# Patient Record
Sex: Female | Born: 1975 | Race: White | Hispanic: No | Marital: Married | Smoking: Former smoker
Health system: Southern US, Community
[De-identification: ages and names within clinical notes are randomized; demographics above are authoritative.]

## PROBLEM LIST (undated history)

## (undated) DIAGNOSIS — F419 Anxiety disorder, unspecified: Secondary | ICD-10-CM

## (undated) DIAGNOSIS — F32A Depression, unspecified: Secondary | ICD-10-CM

## (undated) DIAGNOSIS — K829 Disease of gallbladder, unspecified: Secondary | ICD-10-CM

## (undated) DIAGNOSIS — R002 Palpitations: Secondary | ICD-10-CM

## (undated) DIAGNOSIS — R7303 Prediabetes: Secondary | ICD-10-CM

## (undated) DIAGNOSIS — E039 Hypothyroidism, unspecified: Secondary | ICD-10-CM

## (undated) DIAGNOSIS — R0602 Shortness of breath: Secondary | ICD-10-CM

## (undated) DIAGNOSIS — E559 Vitamin D deficiency, unspecified: Secondary | ICD-10-CM

## (undated) DIAGNOSIS — F17200 Nicotine dependence, unspecified, uncomplicated: Principal | ICD-10-CM

## (undated) DIAGNOSIS — K219 Gastro-esophageal reflux disease without esophagitis: Secondary | ICD-10-CM

## (undated) DIAGNOSIS — F329 Major depressive disorder, single episode, unspecified: Secondary | ICD-10-CM

## (undated) DIAGNOSIS — E7800 Pure hypercholesterolemia, unspecified: Secondary | ICD-10-CM

## (undated) DIAGNOSIS — R12 Heartburn: Secondary | ICD-10-CM

## (undated) DIAGNOSIS — T7840XA Allergy, unspecified, initial encounter: Secondary | ICD-10-CM

## (undated) DIAGNOSIS — K9041 Non-celiac gluten sensitivity: Secondary | ICD-10-CM

## (undated) DIAGNOSIS — E079 Disorder of thyroid, unspecified: Secondary | ICD-10-CM

## (undated) DIAGNOSIS — E739 Lactose intolerance, unspecified: Secondary | ICD-10-CM

## (undated) HISTORY — DX: Non-celiac gluten sensitivity: K90.41

## (undated) HISTORY — DX: Allergy, unspecified, initial encounter: T78.40XA

## (undated) HISTORY — PX: BREAST SURGERY: SHX581

## (undated) HISTORY — DX: Gastro-esophageal reflux disease without esophagitis: K21.9

## (undated) HISTORY — DX: Disorder of thyroid, unspecified: E07.9

## (undated) HISTORY — DX: Pure hypercholesterolemia, unspecified: E78.00

## (undated) HISTORY — DX: Hypothyroidism, unspecified: E03.9

## (undated) HISTORY — DX: Anxiety disorder, unspecified: F41.9

## (undated) HISTORY — DX: Major depressive disorder, single episode, unspecified: F32.9

## (undated) HISTORY — DX: Depression, unspecified: F32.A

## (undated) HISTORY — DX: Heartburn: R12

## (undated) HISTORY — PX: CHOLECYSTECTOMY, LAPAROSCOPIC: SHX56

## (undated) HISTORY — DX: Disease of gallbladder, unspecified: K82.9

## (undated) HISTORY — DX: Lactose intolerance, unspecified: E73.9

## (undated) HISTORY — PX: CHOLECYSTECTOMY: SHX55

## (undated) HISTORY — DX: Palpitations: R00.2

## (undated) HISTORY — DX: Shortness of breath: R06.02

## (undated) HISTORY — DX: Vitamin D deficiency, unspecified: E55.9

## (undated) HISTORY — DX: Nicotine dependence, unspecified, uncomplicated: F17.200

## (undated) HISTORY — DX: Prediabetes: R73.03

---

## 2008-06-16 ENCOUNTER — Ambulatory Visit (HOSPITAL_COMMUNITY): Admission: RE | Admit: 2008-06-16 | Discharge: 2008-06-16 | Payer: Self-pay | Admitting: Obstetrics and Gynecology

## 2008-10-09 ENCOUNTER — Inpatient Hospital Stay (HOSPITAL_COMMUNITY): Admission: AD | Admit: 2008-10-09 | Discharge: 2008-10-12 | Payer: Self-pay | Admitting: Obstetrics & Gynecology

## 2009-12-13 ENCOUNTER — Emergency Department (HOSPITAL_COMMUNITY): Admission: EM | Admit: 2009-12-13 | Discharge: 2009-12-13 | Payer: Self-pay | Admitting: Family Medicine

## 2011-04-04 ENCOUNTER — Emergency Department (HOSPITAL_COMMUNITY)
Admission: EM | Admit: 2011-04-04 | Discharge: 2011-04-04 | Disposition: A | Discharge status: Home / Self Care | Payer: 59 | Attending: Emergency Medicine | Admitting: Emergency Medicine

## 2011-04-04 DIAGNOSIS — F17200 Nicotine dependence, unspecified, uncomplicated: Secondary | ICD-10-CM | POA: Insufficient documentation

## 2011-04-04 DIAGNOSIS — L02419 Cutaneous abscess of limb, unspecified: Secondary | ICD-10-CM | POA: Insufficient documentation

## 2011-04-04 DIAGNOSIS — L03119 Cellulitis of unspecified part of limb: Secondary | ICD-10-CM | POA: Insufficient documentation

## 2011-04-04 DIAGNOSIS — L738 Other specified follicular disorders: Secondary | ICD-10-CM | POA: Insufficient documentation

## 2011-04-04 LAB — POCT PREGNANCY, URINE: Preg Test, Ur: NEGATIVE

## 2011-07-20 LAB — RH IMMUNE GLOB WKUP(>/=20WKS)(NOT WOMEN'S HOSP)

## 2011-07-20 LAB — CBC
HCT: 35.9 % — ABNORMAL LOW (ref 36.0–46.0)
Hemoglobin: 10.9 g/dL — ABNORMAL LOW (ref 12.0–15.0)
Hemoglobin: 12.3 g/dL (ref 12.0–15.0)
MCHC: 34.5 g/dL (ref 30.0–36.0)
MCV: 90.9 fL (ref 78.0–100.0)
RBC: 3.48 MIL/uL — ABNORMAL LOW (ref 3.87–5.11)
RBC: 3.96 MIL/uL (ref 3.87–5.11)
RDW: 14.7 % (ref 11.5–15.5)
WBC: 7.7 10*3/uL (ref 4.0–10.5)
WBC: 9.2 10*3/uL (ref 4.0–10.5)

## 2013-02-23 ENCOUNTER — Ambulatory Visit (INDEPENDENT_AMBULATORY_CARE_PROVIDER_SITE_OTHER): Payer: Self-pay | Admitting: Family Medicine

## 2013-02-23 ENCOUNTER — Encounter: Payer: Self-pay | Admitting: Family Medicine

## 2013-02-23 VITALS — BP 130/94 | HR 76 | Temp 98.1°F | Resp 16 | Wt 196.0 lb

## 2013-02-23 DIAGNOSIS — F341 Dysthymic disorder: Secondary | ICD-10-CM

## 2013-02-23 DIAGNOSIS — F329 Major depressive disorder, single episode, unspecified: Secondary | ICD-10-CM

## 2013-02-23 DIAGNOSIS — F17200 Nicotine dependence, unspecified, uncomplicated: Secondary | ICD-10-CM | POA: Insufficient documentation

## 2013-02-23 HISTORY — DX: Nicotine dependence, unspecified, uncomplicated: F17.200

## 2013-02-23 MED ORDER — LORAZEPAM 0.5 MG PO TABS: 0.5000 mg | ORAL_TABLET | Freq: Three times a day (TID) | ORAL | Status: DC | PRN

## 2013-02-23 MED ORDER — BUPROPION HCL ER (XL) 150 MG PO TB24: 300.0000 mg | ORAL_TABLET | Freq: Every day | ORAL | Status: DC

## 2013-02-23 NOTE — Progress Notes (Signed)
  Subjective:    Patient ID: [name], female    DOB: Oct 23, 1975, 37 y.o.   MRN: [id number]  HPI Patient is here today for assistance with smoking cessation. Unfortunately she's also going to a divorce. She reports depression and increasing anxiety. She's also having episodes of alopecia which she attributes to stress. She's having a difficult time sleeping and concentrating. She like something to help her sleep at night. She's also systems hindgut smoking now even in this time stress. She's taken Wellbutrin in the past and has successfully quit smoking temporarily on this. Furthermore she thinks it also helps with some of her mood symptoms. She's therefore motivated to try this medication. Past Medical History  Diagnosis Date  . Smoker 02/23/2013   No current outpatient prescriptions on file prior to visit.   No current facility-administered medications on file prior to visit.   Allergies  Allergen Reactions  . Penicillins       Review of Systems  All other systems reviewed and are negative.       Objective:   Physical Exam  Vitals reviewed. Cardiovascular: Normal rate and regular rhythm.   Pulmonary/Chest: Effort normal and breath sounds normal.  Psychiatric: She has a normal mood and affect. Her behavior is normal. Judgment and thought content normal.          Assessment & Plan:  1. Smoker I congratulated the patient kind quit smoking. She is assistant to try this now. Therefore we will start Wellbutrin XL 150 mg tablets one by mouth every morning. She's to continue this for one week. She will then increase to 2 tablets by mouth every morning.   2. Depression, reactive Most likely related to her impending divorce. Will start Wellbutrin to help smoking cessation. Also prescribed her Ativan 0.5 mg tablets. She can take 1 tablet every 8 hours as needed for anxiety or insomnia. 30 tablets 0 refills.  Follow up in one month or sooner if worse.

## 2014-03-16 ENCOUNTER — Other Ambulatory Visit: Payer: BC Managed Care – PPO

## 2014-03-16 DIAGNOSIS — F341 Dysthymic disorder: Secondary | ICD-10-CM

## 2014-03-16 DIAGNOSIS — Z79899 Other long term (current) drug therapy: Secondary | ICD-10-CM

## 2014-03-16 DIAGNOSIS — Z0000 Encounter for general adult medical examination without abnormal findings: Secondary | ICD-10-CM

## 2014-03-16 DIAGNOSIS — F17200 Nicotine dependence, unspecified, uncomplicated: Secondary | ICD-10-CM

## 2014-03-16 LAB — CBC WITH DIFFERENTIAL/PLATELET
BASOS ABS: 0.1 10*3/uL (ref 0.0–0.1)
Basophils Relative: 1 % (ref 0–1)
EOS PCT: 4 % (ref 0–5)
Eosinophils Absolute: 0.3 10*3/uL (ref 0.0–0.7)
HEMATOCRIT: 40.7 % (ref 36.0–46.0)
HEMOGLOBIN: 13.9 g/dL (ref 12.0–15.0)
LYMPHS PCT: 46 % (ref 12–46)
Lymphs Abs: 2.9 10*3/uL (ref 0.7–4.0)
MCH: 29.1 pg (ref 26.0–34.0)
MCHC: 34.2 g/dL (ref 30.0–36.0)
MCV: 85.3 fL (ref 78.0–100.0)
MONO ABS: 0.6 10*3/uL (ref 0.1–1.0)
MONOS PCT: 9 % (ref 3–12)
Neutro Abs: 2.5 10*3/uL (ref 1.7–7.7)
Neutrophils Relative %: 40 % — ABNORMAL LOW (ref 43–77)
Platelets: 260 10*3/uL (ref 150–400)
RBC: 4.77 MIL/uL (ref 3.87–5.11)
RDW: 14.5 % (ref 11.5–15.5)
WBC: 6.3 10*3/uL (ref 4.0–10.5)

## 2014-03-16 LAB — LIPID PANEL
CHOL/HDL RATIO: 4.2 ratio
Cholesterol: 191 mg/dL (ref 0–200)
HDL: 45 mg/dL (ref >39)
LDL CALC: 115 mg/dL — AB (ref 0–99)
Triglycerides: 154 mg/dL — ABNORMAL HIGH (ref <150)
VLDL: 31 mg/dL (ref 0–40)

## 2014-03-16 LAB — COMPLETE METABOLIC PANEL WITH GFR
ALK PHOS: 54 U/L (ref 39–117)
ALT: 12 U/L (ref 0–35)
AST: 14 U/L (ref 0–37)
Albumin: 4.1 g/dL (ref 3.5–5.2)
BILIRUBIN TOTAL: 0.2 mg/dL (ref 0.2–1.2)
BUN: 14 mg/dL (ref 6–23)
CO2: 23 mEq/L (ref 19–32)
CREATININE: 0.72 mg/dL (ref 0.50–1.10)
Calcium: 9.3 mg/dL (ref 8.4–10.5)
Chloride: 104 mEq/L (ref 96–112)
GFR, Est African American: >89 mL/min
GLUCOSE: 86 mg/dL (ref 70–99)
Potassium: 3.8 mEq/L (ref 3.5–5.3)
Sodium: 137 mEq/L (ref 135–145)
Total Protein: 6.9 g/dL (ref 6.0–8.3)

## 2014-03-16 LAB — TSH: TSH: 4.483 u[IU]/mL (ref 0.350–4.500)

## 2014-03-17 LAB — VITAMIN D 25 HYDROXY (VIT D DEFICIENCY, FRACTURES): Vit D, 25-Hydroxy: 60 ng/mL (ref 30–89)

## 2014-03-18 ENCOUNTER — Ambulatory Visit (INDEPENDENT_AMBULATORY_CARE_PROVIDER_SITE_OTHER): Payer: BC Managed Care – PPO | Admitting: Family Medicine

## 2014-03-18 ENCOUNTER — Encounter: Payer: Self-pay | Admitting: Family Medicine

## 2014-03-18 VITALS — BP 138/70 | HR 76 | Temp 97.9°F | Resp 14 | Ht 64.0 in | Wt 199.0 lb

## 2014-03-18 DIAGNOSIS — Z79899 Other long term (current) drug therapy: Secondary | ICD-10-CM

## 2014-03-18 DIAGNOSIS — Z0000 Encounter for general adult medical examination without abnormal findings: Secondary | ICD-10-CM

## 2014-03-18 DIAGNOSIS — F411 Generalized anxiety disorder: Secondary | ICD-10-CM

## 2014-03-18 DIAGNOSIS — F419 Anxiety disorder, unspecified: Secondary | ICD-10-CM | POA: Insufficient documentation

## 2014-03-18 MED ORDER — HYDROCODONE-ACETAMINOPHEN 5-325 MG PO TABS: 1.0000 | ORAL_TABLET | Freq: Four times a day (QID) | ORAL | Status: DC | PRN

## 2014-03-18 MED ORDER — VENLAFAXINE HCL ER 75 MG PO CP24: 150.0000 mg | ORAL_CAPSULE | Freq: Every day | ORAL | Status: DC

## 2014-03-18 NOTE — Progress Notes (Signed)
Subjective:    Patient ID: Karen [name], female    DOB: 07/10/1976, 38 y.o.   MRN: [id number]  HPI This is a 38 year old white female who is here today for complete physical exam. She is overdue for Pap smear. Her biggest medical problem is smoking. However she smokes [place] anxiety. She states that she suffers from daily generalized anxiety. She smokes as a stress reliever. She does not believe that she would be able to conquer her smoking habit and to better control her anxiety. She also is receiving electrolysis treatments to her face for hirsutism.  She goes once a week. This is extremely painful. She tried 800 mg ibuprofen is not helping. She is wondering if we can get her up she did take 30 minutes before the treatment once a week to help minimize the pain.  In the past he has tried Lexapro for anxiety. This causes libido. She tried Wellbutrin which did not help. Past Medical History  Diagnosis Date  . Smoker 02/23/2013  . Smoker   . Anxiety   . Depression    No past surgical history on file. Current Outpatient Prescriptions on File Prior to Visit  Medication Sig Dispense Refill  . fish oil-omega-3 fatty acids 1000 MG capsule Take 1 g by mouth daily.      Marland Kitchen. aspirin 81 MG tablet Take 81 mg by mouth daily.      Marland Kitchen. buPROPion (WELLBUTRIN XL) 150 MG 24 hr tablet Take 2 tablets (300 mg total) by mouth daily.  60 tablet  5  . LORazepam (ATIVAN) 0.5 MG tablet Take 1 tablet (0.5 mg total) by mouth every 8 (eight) hours as needed for anxiety.  30 tablet  0   No current facility-administered medications on file prior to visit.   Allergies  Allergen Reactions  . Penicillins    History   Social History  . Marital Status: Married    Spouse Name: N/A    Number of Children: N/A  . Years of Education: N/A   Occupational History  . Not on file.   Social History Main Topics  . Smoking status: Current Every Day Smoker    Types: Cigarettes  . Smokeless tobacco: Never Used     Comment: 3/4  pack a day  . Alcohol Use: Yes     Comment: Rare  . Drug Use: No  . Sexual Activity: Yes   Other Topics Concern  . Not on file   Social History Narrative  . No narrative on file   Family History  Problem Relation Age of Onset  . Diabetes Mother   . Hyperlipidemia Mother   . Hypertension Mother   . Hypertension Father       Review of Systems  All other systems reviewed and are negative.      Objective:   Physical Exam  Vitals reviewed. Constitutional: She is oriented to person, place, and time. She appears well-developed and well-nourished. No distress.  HENT:  Head: Normocephalic and atraumatic.  Right Ear: External ear normal.  Left Ear: External ear normal.  Nose: Nose normal.  Mouth/Throat: Oropharynx is clear and moist. No oropharyngeal exudate.  Eyes: Conjunctivae and EOM are normal. Pupils are equal, round, and reactive to light. Right eye exhibits no discharge. Left eye exhibits no discharge. No scleral icterus.  Neck: Normal range of motion. Neck supple. No JVD present. No tracheal deviation present. No thyromegaly present.  Cardiovascular: Normal rate, regular rhythm, normal heart sounds and intact distal pulses.  Exam reveals no gallop and no friction rub.   No murmur heard. Pulmonary/Chest: Effort normal and breath sounds normal. No stridor. No respiratory distress. She has no wheezes. She has no rales. She exhibits no tenderness.  Abdominal: Soft. Bowel sounds are normal. She exhibits no distension and no mass. There is no tenderness. There is no rebound and no guarding.  Genitourinary: Vagina normal.  Musculoskeletal: Normal range of motion. She exhibits no edema and no tenderness.  Lymphadenopathy:    She has no cervical adenopathy.  Neurological: She is alert and oriented to person, place, and time. She has normal reflexes. She displays normal reflexes. No cranial nerve deficit. She exhibits normal muscle tone. Coordination normal.  Skin: Skin is warm.  No rash noted. She is not diaphoretic. No erythema. No pallor.  Psychiatric: She has a normal mood and affect. Her behavior is normal. Judgment and thought content normal.          Assessment & Plan:  1. Routine general medical examination at a health care facility I reviewed the patient's CBC CMP fasting lipid panel and TSH all of which were within normal limits. Performed a Pap smear. This was sent to pathology. The remainder of her preventative care is up-to-date. - PAP, Thin Prep w/HPV rflx HPV Type 16/18  2. Encounter for long-term (current) use of other medications Discontinue Wellbutrin and lorazepam  3. GAD (generalized anxiety disorder) Begin Effexor XR 75 mg by mouth every morning and in one week increase to 150 mg by mouth every morning. Recheck in one month.  Once we have better manage anxiety I like to focus on smoking cessation.  I did give the patient prescription for Vicodin 5/325 one by mouth every week 30 minutes prior to electrolysis. I gave her 60 tablets. This should last for 1 year. - venlafaxine XR (EFFEXOR-XR) 75 MG 24 hr capsule; Take 2 capsules (150 mg total) by mouth daily with breakfast.  Dispense: 60 capsule; Refill: 5

## 2014-03-22 LAB — PAP, THIN PREP W/HPV RFLX HPV TYPE 16/18: HPV DNA HIGH RISK: NOT DETECTED

## 2014-05-06 ENCOUNTER — Telehealth: Payer: Self-pay | Admitting: Family Medicine

## 2014-05-06 NOTE — Telephone Encounter (Signed)
Patient calling back to let dr pickard know how her effexor  was working  She says it is not really doing anything for her please call patient back at  [phone]

## 2014-05-10 NOTE — Telephone Encounter (Signed)
We could try brintellix 10 mg poqday and dc effexor if she is interested.

## 2014-05-11 NOTE — Telephone Encounter (Signed)
Pt wishes to not start another medication at this time. She would like to check with her insurance to see if Chantix is covered and would like to try that for the smoking but does not feel she needs something daily for anxiety.

## 2015-12-09 ENCOUNTER — Ambulatory Visit (INDEPENDENT_AMBULATORY_CARE_PROVIDER_SITE_OTHER): Payer: 59 | Admitting: Family Medicine

## 2015-12-09 ENCOUNTER — Encounter: Payer: Self-pay | Admitting: Family Medicine

## 2015-12-09 VITALS — BP 114/80 | HR 76 | Temp 98.4°F | Resp 18 | Ht 62.5 in | Wt 222.0 lb

## 2015-12-09 DIAGNOSIS — Z0000 Encounter for general adult medical examination without abnormal findings: Secondary | ICD-10-CM

## 2015-12-09 DIAGNOSIS — Z720 Tobacco use: Secondary | ICD-10-CM

## 2015-12-09 DIAGNOSIS — F17200 Nicotine dependence, unspecified, uncomplicated: Secondary | ICD-10-CM

## 2015-12-09 LAB — CBC WITH DIFFERENTIAL/PLATELET
BASOS PCT: 0 % (ref 0–1)
Basophils Absolute: 0 10*3/uL (ref 0.0–0.1)
Eosinophils Absolute: 0.1 10*3/uL (ref 0.0–0.7)
Eosinophils Relative: 2 % (ref 0–5)
HEMATOCRIT: 47 % — AB (ref 36.0–46.0)
Hemoglobin: 15.8 g/dL — ABNORMAL HIGH (ref 12.0–15.0)
Lymphocytes Relative: 37 % (ref 12–46)
Lymphs Abs: 2.3 10*3/uL (ref 0.7–4.0)
MCH: 29.3 pg (ref 26.0–34.0)
MCHC: 33.6 g/dL (ref 30.0–36.0)
MCV: 87 fL (ref 78.0–100.0)
MPV: 10.2 fL (ref 8.6–12.4)
Monocytes Absolute: 0.5 10*3/uL (ref 0.1–1.0)
Monocytes Relative: 8 % (ref 3–12)
NEUTROS ABS: 3.3 10*3/uL (ref 1.7–7.7)
NEUTROS PCT: 53 % (ref 43–77)
PLATELETS: 250 10*3/uL (ref 150–400)
RBC: 5.4 MIL/uL — AB (ref 3.87–5.11)
RDW: 15.1 % (ref 11.5–15.5)
WBC: 6.2 10*3/uL (ref 4.0–10.5)

## 2015-12-09 LAB — COMPLETE METABOLIC PANEL WITH GFR
ALT: 13 U/L (ref 6–29)
AST: 13 U/L (ref 10–30)
Albumin: 4 g/dL (ref 3.6–5.1)
Alkaline Phosphatase: 64 U/L (ref 33–115)
BILIRUBIN TOTAL: 0.3 mg/dL (ref 0.2–1.2)
BUN: 7 mg/dL (ref 7–25)
CALCIUM: 9.2 mg/dL (ref 8.6–10.2)
CHLORIDE: 104 mmol/L (ref 98–110)
CO2: 20 mmol/L (ref 20–31)
CREATININE: 0.66 mg/dL (ref 0.50–1.10)
Glucose, Bld: 87 mg/dL (ref 70–99)
Potassium: 4.2 mmol/L (ref 3.5–5.3)
Sodium: 137 mmol/L (ref 135–146)
Total Protein: 7.2 g/dL (ref 6.1–8.1)

## 2015-12-09 LAB — LIPID PANEL
CHOLESTEROL: 201 mg/dL — AB (ref 125–200)
HDL: 46 mg/dL (ref >=46)
LDL Cholesterol: 132 mg/dL — ABNORMAL HIGH (ref <130)
TRIGLYCERIDES: 116 mg/dL (ref <150)
Total CHOL/HDL Ratio: 4.4 Ratio (ref <=5.0)
VLDL: 23 mg/dL (ref <30)

## 2015-12-09 MED ORDER — VARENICLINE TARTRATE 0.5 MG X 11 & 1 MG X 42 PO MISC: ORAL | Status: DC

## 2015-12-09 MED ORDER — VARENICLINE TARTRATE 1 MG PO TABS: 1.0000 mg | ORAL_TABLET | Freq: Two times a day (BID) | ORAL | Status: DC

## 2015-12-09 NOTE — Progress Notes (Signed)
Subjective:    Patient ID: Karen [name], female    DOB: August 16, 1976, 40 y.o.   MRN: [id number]  HPI  This is a 40 year old white female who is here today for complete physical exam. Her last Pap smear was 2015 and was normal. It is not due again until 2018. She will be due for mammogram when she turns 40 this may. Her biggest medical problem is smoking. She is interested in quitting smoking. She is also encouraging her husband to quit smoking. She would like to try Chantix again which did work for her in the past without any side effects. Patient works as a Engineer, civil (consulting) on the renal floor at [name] Past Medical History  Diagnosis Date  . Smoker 02/23/2013  . Smoker   . Anxiety   . Depression    No past surgical history on file. Current Outpatient Prescriptions on File Prior to Visit  Medication Sig Dispense Refill  . aspirin 81 MG tablet Take 81 mg by mouth daily.    . fish oil-omega-3 fatty acids 1000 MG capsule Take 1 g by mouth daily.    Marland Kitchen ibuprofen (ADVIL,MOTRIN) 800 MG tablet Take 800 mg by mouth once a week.    Marland Kitchen buPROPion (WELLBUTRIN XL) 150 MG 24 hr tablet Take 2 tablets (300 mg total) by mouth daily. (Patient not taking: Reported on 12/09/2015) 60 tablet 5  . LORazepam (ATIVAN) 0.5 MG tablet Take 1 tablet (0.5 mg total) by mouth every 8 (eight) hours as needed for anxiety. (Patient not taking: Reported on 12/09/2015) 30 tablet 0  . venlafaxine XR (EFFEXOR-XR) 75 MG 24 hr capsule Take 2 capsules (150 mg total) by mouth daily with breakfast. (Patient not taking: Reported on 12/09/2015) 60 capsule 5   No current facility-administered medications on file prior to visit.   Allergies  Allergen Reactions  . Penicillins    Social History   Social History  . Marital Status: Married    Spouse Name: N/A  . Number of Children: N/A  . Years of Education: N/A   Occupational History  . Not on file.   Social History Main Topics  . Smoking status: Current Every Day Smoker   Types: Cigarettes  . Smokeless tobacco: Never Used     Comment: 3/4 pack a day  . Alcohol Use: Yes     Comment: Rare  . Drug Use: No  . Sexual Activity: Yes   Other Topics Concern  . Not on file   Social History Narrative   Family History  Problem Relation Age of Onset  . Diabetes Mother   . Hyperlipidemia Mother   . Hypertension Mother   . Hypertension Father       Review of Systems  All other systems reviewed and are negative.      Objective:   Physical Exam  Constitutional: She is oriented to person, place, and time. She appears well-developed and well-nourished. No distress.  HENT:  Head: Normocephalic and atraumatic.  Right Ear: External ear normal.  Left Ear: External ear normal.  Nose: Nose normal.  Mouth/Throat: Oropharynx is clear and moist. No oropharyngeal exudate.  Eyes: Conjunctivae and EOM are normal. Pupils are equal, round, and reactive to light. Right eye exhibits no discharge. Left eye exhibits no discharge. No scleral icterus.  Neck: Normal range of motion. Neck supple. No JVD present. No tracheal deviation present. No thyromegaly present.  Cardiovascular: Normal rate, regular rhythm, normal heart sounds and intact distal pulses.  Exam reveals no  gallop and no friction rub.   No murmur heard. Pulmonary/Chest: Effort normal and breath sounds normal. No stridor. No respiratory distress. She has no wheezes. She has no rales. She exhibits no tenderness.  Abdominal: Soft. Bowel sounds are normal. She exhibits no distension and no mass. There is no tenderness. There is no rebound and no guarding.  Genitourinary: Vagina normal.  Musculoskeletal: Normal range of motion. She exhibits no edema or tenderness.  Lymphadenopathy:    She has no cervical adenopathy.  Neurological: She is alert and oriented to person, place, and time. She has normal reflexes. No cranial nerve deficit. She exhibits normal muscle tone. Coordination normal.  Skin: Skin is warm. No rash  noted. She is not diaphoretic. No erythema. No pallor.  Psychiatric: She has a normal mood and affect. Her behavior is normal. Judgment and thought content normal.  Vitals reviewed.         Assessment & Plan:  Smoking - Plan: varenicline (CHANTIX STARTING MONTH PAK) 0.5 MG X 11 & 1 MG X 42 tablet  Routine general medical examination at a health care facility - Plan: CBC with Differential/Platelet, COMPLETE METABOLIC PANEL WITH GFR, Lipid panel, MM Digital Screening  Medical examination significant for obesity. I recommended diet exercise and weight loss. I'm very proud of the patient for him to quit smoking. I gave the patient a prescription for Chantix. I gave her a starter pack. In one month she can begin the continuing pack and take that for up to 3 months if necessary. Also recommended calcium and vitamin D for prevention of osteoporosis. I will schedule the patient for mammogram after she turns 40. Her flu shot is up-to-date. Her Pap smear is not due until next year. I will also check a CBC, CMP, and fasting lipid panel.

## 2015-12-14 ENCOUNTER — Encounter: Payer: Self-pay | Admitting: Family Medicine

## 2016-01-09 ENCOUNTER — Other Ambulatory Visit: Payer: Self-pay | Admitting: Family Medicine

## 2016-01-09 DIAGNOSIS — Z1231 Encounter for screening mammogram for malignant neoplasm of breast: Secondary | ICD-10-CM

## 2016-01-26 ENCOUNTER — Ambulatory Visit (HOSPITAL_COMMUNITY)
Admission: RE | Admit: 2016-01-26 | Discharge: 2016-01-26 | Disposition: A | Discharge status: Home / Self Care | Payer: 59 | Source: Ambulatory Visit | Attending: Family Medicine | Admitting: Family Medicine

## 2016-01-26 ENCOUNTER — Encounter: Payer: Self-pay | Admitting: Family Medicine

## 2016-01-26 ENCOUNTER — Ambulatory Visit (INDEPENDENT_AMBULATORY_CARE_PROVIDER_SITE_OTHER): Payer: 59 | Admitting: Family Medicine

## 2016-01-26 VITALS — BP 128/70 | HR 80 | Temp 98.2°F | Resp 16 | Ht 62.5 in | Wt 228.0 lb

## 2016-01-26 DIAGNOSIS — M5432 Sciatica, left side: Secondary | ICD-10-CM | POA: Insufficient documentation

## 2016-01-26 DIAGNOSIS — M545 Low back pain: Secondary | ICD-10-CM | POA: Diagnosis not present

## 2016-01-26 MED ORDER — PREDNISONE 20 MG PO TABS: ORAL_TABLET | ORAL | Status: DC

## 2016-01-26 NOTE — Progress Notes (Signed)
   Subjective:    Patient ID: [name], female    DOB: 07/16/1976, 40 y.o.   MRN: [id number]  HPI 2 years ago, the patient injured her tailbone while driving to [place]. By the time she had arrived to [place], her entire sacral area was numb and tingling. She has had low back pain with numbness and tingling around the sacrum ever since. 2 weeks ago the symptoms worsened. She is developed low back pain with neuropathic burning and stinging pain radiating into her left gluteus, down her left hamstring, all the way into her left foot. She denies any saddle anesthesia. She denies any bowel or bladder incontinence. However the pain is becoming very severe.mh No past surgical history on file. Current Outpatient Prescriptions on File Prior to Visit  Medication Sig Dispense Refill  . aspirin 81 MG tablet Take 81 mg by mouth daily.    . fish oil-omega-3 fatty acids 1000 MG capsule Take 1 g by mouth daily.    Marland Kitchen. ibuprofen (ADVIL,MOTRIN) 800 MG tablet Take 800 mg by mouth once a week.    . varenicline (CHANTIX CONTINUING MONTH PAK) 1 MG tablet Take 1 tablet (1 mg total) by mouth 2 (two) times daily. (Patient not taking: Reported on 01/26/2016) 60 tablet 2  . varenicline (CHANTIX STARTING MONTH PAK) 0.5 MG X 11 & 1 MG X 42 tablet Take one 0.5 mg tablet by mouth once daily for 3 days, then increase to one 0.5 mg tablet twice daily for 4 days, then increase to one 1 mg tablet twice daily. (Patient not taking: Reported on 01/26/2016) 53 tablet 0   No current facility-administered medications on file prior to visit.   Allergies  Allergen Reactions  . Penicillins    Social History   Social History  . Marital Status: Married    Spouse Name: N/A  . Number of Children: N/A  . Years of Education: N/A   Occupational History  . Not on file.   Social History Main Topics  . Smoking status: Current Every Day Smoker    Types: Cigarettes  . Smokeless tobacco: Never Used     Comment: 3/4 pack a day    . Alcohol Use: Yes     Comment: Rare  . Drug Use: No  . Sexual Activity: Yes   Other Topics Concern  . Not on file   Social History Narrative    .p   Review of Systems  All other systems reviewed and are negative.      Objective:   Physical Exam  Cardiovascular: Normal rate, regular rhythm and normal heart sounds.   Pulmonary/Chest: Effort normal and breath sounds normal. No respiratory distress. She has no wheezes. She has no rales.  Musculoskeletal:       Lumbar back: She exhibits decreased range of motion, tenderness and pain. She exhibits no spasm.  Neurological: She displays normal reflexes. She exhibits normal muscle tone. Coordination normal.  Vitals reviewed.         Assessment & Plan:  Sciatica associated with disorder of lumbar spine, left - Plan: DG Lumbar Spine Complete, predniSONE (DELTASONE) 20 MG tablet Patient has left-sided sciatica with no evidence of cauda equina syndrome. Begin prednisone taper pack. Obtain x-rays of the lumbar spine. If symptoms do not improve, proceed with an MRI of the lumbar spine.

## 2016-02-20 MED FILL — CHANTIX STARTING MONTH BOX: 0.5 MG X 11 | 28 days supply | Qty: 53 | Fill #0

## 2016-02-23 ENCOUNTER — Ambulatory Visit
Admission: RE | Admit: 2016-02-23 | Discharge: 2016-02-23 | Disposition: A | Discharge status: Home / Self Care | Payer: 59 | Source: Ambulatory Visit | Attending: Family Medicine | Admitting: Family Medicine

## 2016-02-23 DIAGNOSIS — Z1231 Encounter for screening mammogram for malignant neoplasm of breast: Secondary | ICD-10-CM | POA: Diagnosis not present

## 2016-03-23 MED FILL — CHANTIX 1 MG TABLET: 1 | 28 days supply | Qty: 56 | Fill #0

## 2016-04-20 MED FILL — CHANTIX 1 MG TABLET: 1 | 28 days supply | Qty: 56 | Fill #1

## 2016-04-27 ENCOUNTER — Ambulatory Visit: Payer: 59 | Admitting: Family Medicine

## 2016-04-27 ENCOUNTER — Encounter: Payer: Self-pay | Admitting: Family Medicine

## 2016-04-27 ENCOUNTER — Ambulatory Visit (INDEPENDENT_AMBULATORY_CARE_PROVIDER_SITE_OTHER): Payer: 59 | Admitting: Family Medicine

## 2016-04-27 VITALS — BP 138/94 | HR 76 | Temp 98.6°F | Resp 16 | Wt 233.0 lb

## 2016-04-27 DIAGNOSIS — R002 Palpitations: Secondary | ICD-10-CM | POA: Diagnosis not present

## 2016-04-27 LAB — CBC WITH DIFFERENTIAL/PLATELET
BASOS ABS: 0 {cells}/uL (ref 0–200)
Basophils Relative: 0 %
EOS ABS: 288 {cells}/uL (ref 15–500)
EOS PCT: 4 %
HEMATOCRIT: 42.4 % (ref 35.0–45.0)
HEMOGLOBIN: 14.4 g/dL (ref 12.0–15.0)
LYMPHS ABS: 3600 {cells}/uL (ref 850–3900)
Lymphocytes Relative: 50 %
MCH: 29.4 pg (ref 27.0–33.0)
MCHC: 34 g/dL (ref 32.0–36.0)
MCV: 86.7 fL (ref 80.0–100.0)
MPV: 9.6 fL (ref 7.5–12.5)
Monocytes Absolute: 576 cells/uL (ref 200–950)
Monocytes Relative: 8 %
NEUTROS ABS: 2736 {cells}/uL (ref 1500–7800)
NEUTROS PCT: 38 %
Platelets: 257 10*3/uL (ref 140–400)
RBC: 4.89 MIL/uL (ref 3.80–5.10)
RDW: 14.3 % (ref 11.0–15.0)
WBC: 7.2 10*3/uL (ref 3.8–10.8)

## 2016-04-27 LAB — COMPLETE METABOLIC PANEL WITH GFR
ALBUMIN: 3.6 g/dL (ref 3.6–5.1)
ALK PHOS: 66 U/L (ref 33–115)
ALT: 18 U/L (ref 6–29)
AST: 15 U/L (ref 10–30)
BILIRUBIN TOTAL: 0.2 mg/dL (ref 0.2–1.2)
BUN: 8 mg/dL (ref 7–25)
CALCIUM: 9.9 mg/dL (ref 8.6–10.2)
CO2: 20 mmol/L (ref 20–31)
Chloride: 103 mmol/L (ref 98–110)
Creat: 0.62 mg/dL (ref 0.50–1.10)
GLUCOSE: 85 mg/dL (ref 70–99)
POTASSIUM: 4 mmol/L (ref 3.5–5.3)
Sodium: 136 mmol/L (ref 135–146)
TOTAL PROTEIN: 6.8 g/dL (ref 6.1–8.1)

## 2016-04-27 LAB — TSH: TSH: 5.49 m[IU]/L — AB

## 2016-04-27 NOTE — Progress Notes (Signed)
   Subjective:    Patient ID: [name], female    DOB: 05/24/1976, 40 y.o.   MRN: [id number]  Palpitations   Over the last week, the patient reports numerous palpitations. They last only 1 or 2 seconds. She denies any syncope or presyncope or chest pain or shortness of breath. She blames them on Chantix. She denies any increased consumption of caffeine. She denies any insomnia. She denies any stress. She denies any stimulant use. EKG today shows normal sinus rhythm with normal axis and no evidence of ischemia or infarction  Past Medical History  Diagnosis Date  . Smoker 02/23/2013  . Smoker   . Anxiety   . Depression     No past surgical history on file. Current Outpatient Prescriptions on File Prior to Visit  Medication Sig Dispense Refill  . aspirin 81 MG tablet Take 81 mg by mouth daily.    . fish oil-omega-3 fatty acids 1000 MG capsule Take 1 g by mouth daily.    Marland Kitchen. ibuprofen (ADVIL,MOTRIN) 800 MG tablet Take 800 mg by mouth once a week.    . varenicline (CHANTIX CONTINUING MONTH PAK) 1 MG tablet Take 1 tablet (1 mg total) by mouth 2 (two) times daily. 60 tablet 2   No current facility-administered medications on file prior to visit.   Allergies  Allergen Reactions  . Penicillins    Social History   Social History  . Marital Status: Married    Spouse Name: N/A  . Number of Children: N/A  . Years of Education: N/A   Occupational History  . Not on file.   Social History Main Topics  . Smoking status: Former Smoker    Types: Cigarettes  . Smokeless tobacco: Former NeurosurgeonUser    Quit date: 03/25/2016  . Alcohol Use: 0.0 oz/week    0 Standard drinks or equivalent per week     Comment: Rare  . Drug Use: No  . Sexual Activity: Yes   Other Topics Concern  . Not on file   Social History Narrative     Review of Systems  Cardiovascular: Positive for palpitations.  All other systems reviewed and are negative.      Objective:   Physical Exam  Constitutional: She  appears well-developed and well-nourished. No distress.  Neck: No JVD present. No thyromegaly present.  Cardiovascular: Normal rate, regular rhythm and normal heart sounds.   Pulmonary/Chest: Effort normal and breath sounds normal. No respiratory distress. She has no wheezes. She has no rales.  Abdominal: Soft. Bowel sounds are normal.  Lymphadenopathy:    She has no cervical adenopathy.  Neurological: She displays normal reflexes. She exhibits normal muscle tone. Coordination normal.  Skin: She is not diaphoretic.  Vitals reviewed.         Assessment & Plan:  Palpitations - Plan: EKG 12-Lead, CBC with Differential/Platelet, COMPLETE METABOLIC PANEL WITH GFR, TSH Description certainly sounds like PVCs. I will obtain a Holter monitor to verify. EKG is normal. Check CBC to rule out anemia. Check CMP to evaluate electrolyte abnormalities. Check TSH to evaluate for thyroid problems. At the present time the patient is not interested in Toprol-XL but rather like just peace of mind. If symptoms worsen, we can try Toprol-XL.

## 2016-05-11 ENCOUNTER — Telehealth: Payer: Self-pay | Admitting: Family Medicine

## 2016-05-11 NOTE — Telephone Encounter (Signed)
Holter monitor results - Dr. Tanya Nones states that it show occasional PVC's & PAC's but no dangerous arrhythmia's.  Called placed to pt and [facility]

## 2016-05-16 NOTE — Telephone Encounter (Signed)
Patient aware of results.

## 2016-05-25 ENCOUNTER — Encounter: Payer: Self-pay | Admitting: Family Medicine

## 2016-12-27 ENCOUNTER — Ambulatory Visit (INDEPENDENT_AMBULATORY_CARE_PROVIDER_SITE_OTHER): Payer: 59 | Admitting: Family Medicine

## 2016-12-27 ENCOUNTER — Encounter: Payer: Self-pay | Admitting: Family Medicine

## 2016-12-27 VITALS — BP 118/66 | HR 78 | Temp 98.7°F | Resp 14 | Ht 62.5 in | Wt 243.0 lb

## 2016-12-27 DIAGNOSIS — Z0000 Encounter for general adult medical examination without abnormal findings: Secondary | ICD-10-CM

## 2016-12-27 DIAGNOSIS — E039 Hypothyroidism, unspecified: Secondary | ICD-10-CM | POA: Diagnosis not present

## 2016-12-27 DIAGNOSIS — Z124 Encounter for screening for malignant neoplasm of cervix: Secondary | ICD-10-CM | POA: Diagnosis not present

## 2016-12-27 DIAGNOSIS — E038 Other specified hypothyroidism: Secondary | ICD-10-CM

## 2016-12-27 DIAGNOSIS — Z87891 Personal history of nicotine dependence: Secondary | ICD-10-CM | POA: Diagnosis not present

## 2016-12-27 LAB — CBC WITH DIFFERENTIAL/PLATELET
BASOS ABS: 0 {cells}/uL (ref 0–200)
Basophils Relative: 0 %
Eosinophils Absolute: 195 cells/uL (ref 15–500)
Eosinophils Relative: 3 %
HEMATOCRIT: 42.3 % (ref 35.0–45.0)
HEMOGLOBIN: 13.9 g/dL (ref 12.0–15.0)
LYMPHS ABS: 2860 {cells}/uL (ref 850–3900)
Lymphocytes Relative: 44 %
MCH: 28 pg (ref 27.0–33.0)
MCHC: 32.9 g/dL (ref 32.0–36.0)
MCV: 85.1 fL (ref 80.0–100.0)
MONO ABS: 520 {cells}/uL (ref 200–950)
MPV: 10.1 fL (ref 7.5–12.5)
Monocytes Relative: 8 %
NEUTROS PCT: 45 %
Neutro Abs: 2925 cells/uL (ref 1500–7800)
Platelets: 262 10*3/uL (ref 140–400)
RBC: 4.97 MIL/uL (ref 3.80–5.10)
RDW: 14.5 % (ref 11.0–15.0)
WBC: 6.5 10*3/uL (ref 3.8–10.8)

## 2016-12-27 LAB — COMPLETE METABOLIC PANEL WITH GFR
ALBUMIN: 3.9 g/dL (ref 3.6–5.1)
ALK PHOS: 73 U/L (ref 33–115)
ALT: 17 U/L (ref 6–29)
AST: 16 U/L (ref 10–30)
BILIRUBIN TOTAL: 0.3 mg/dL (ref 0.2–1.2)
BUN: 7 mg/dL (ref 7–25)
CALCIUM: 9.4 mg/dL (ref 8.6–10.2)
CO2: 22 mmol/L (ref 20–31)
Chloride: 105 mmol/L (ref 98–110)
Creat: 0.85 mg/dL (ref 0.50–1.10)
GFR, EST NON AFRICAN AMERICAN: 86 mL/min (ref >=60)
GFR, Est African American: >89 mL/min (ref >=60)
Glucose, Bld: 93 mg/dL (ref 70–99)
POTASSIUM: 4 mmol/L (ref 3.5–5.3)
Sodium: 137 mmol/L (ref 135–146)
Total Protein: 6.8 g/dL (ref 6.1–8.1)

## 2016-12-27 LAB — LIPID PANEL
CHOL/HDL RATIO: 3.5 ratio (ref <5.0)
Cholesterol: 187 mg/dL (ref <200)
HDL: 53 mg/dL (ref >50)
LDL CALC: 109 mg/dL — AB (ref <100)
TRIGLYCERIDES: 126 mg/dL (ref <150)
VLDL: 25 mg/dL (ref <30)

## 2016-12-27 LAB — TSH: TSH: 9.4 mIU/L — ABNORMAL HIGH

## 2016-12-27 NOTE — Progress Notes (Signed)
Subjective:    Patient ID: [name], female    DOB: 09-01-1976, 41 y.o.   MRN: [id number]  HPI  Here for CPE.   Her last pap was 2015.  Last mammogram was 2017.  Also interested in weight loss options.  Has been cigarette free for 9 months.    Past Medical History:  Diagnosis Date  . Anxiety   . Depression   . Smoker 02/23/2013  . Smoker    History reviewed. No pertinent surgical history.  Current Outpatient Prescriptions on File Prior to Visit  Medication Sig Dispense Refill  . aspirin 81 MG tablet Take 81 mg by mouth daily.    . fish oil-omega-3 fatty acids 1000 MG capsule Take 1 g by mouth daily.     No current facility-administered medications on file prior to visit.    Allergies  Allergen Reactions  . Penicillins    Social History   Social History  . Marital status: Married    Spouse name: N/A  . Number of children: N/A  . Years of education: N/A   Occupational History  . Not on file.   Social History Main Topics  . Smoking status: Former Smoker    Types: Cigarettes  . Smokeless tobacco: Former Neurosurgeon    Quit date: 03/25/2016  . Alcohol use 0.0 oz/week     Comment: Rare  . Drug use: No  . Sexual activity: Yes   Other Topics Concern  . Not on file   Social History Narrative  . No narrative on file   Family History  Problem Relation Age of Onset  . Diabetes Mother   . Hyperlipidemia Mother   . Hypertension Mother   . Hypertension Father      Review of Systems     Objective:   Physical Exam  Constitutional: She is oriented to person, place, and time. She appears well-developed and well-nourished. No distress.  HENT:  Head: Normocephalic and atraumatic.  Right Ear: External ear normal.  Left Ear: External ear normal.  Nose: Nose normal.  Mouth/Throat: Oropharynx is clear and moist. No oropharyngeal exudate.  Eyes: Conjunctivae and EOM are normal. Pupils are equal, round, and reactive to light. Right eye exhibits no discharge. Left eye  exhibits no discharge. No scleral icterus.  Neck: Normal range of motion. Neck supple. No JVD present. No tracheal deviation present. No thyromegaly present.  Cardiovascular: Normal rate, regular rhythm, normal heart sounds and intact distal pulses.  Exam reveals no gallop and no friction rub.   No murmur heard. Pulmonary/Chest: Effort normal and breath sounds normal. No stridor. No respiratory distress. She has no wheezes. She has no rales. She exhibits no tenderness.  Abdominal: Soft. Bowel sounds are normal. She exhibits no distension. There is no tenderness. There is no rebound and no guarding.  Musculoskeletal: Normal range of motion. She exhibits no edema, tenderness or deformity.  Lymphadenopathy:    She has no cervical adenopathy.  Neurological: She is alert and oriented to person, place, and time. She has normal reflexes. She displays normal reflexes. No cranial nerve deficit. She exhibits normal muscle tone. Coordination normal.  Skin: Skin is warm. No rash noted. She is not diaphoretic. No erythema. No pallor.  Psychiatric: She has a normal mood and affect. Her behavior is normal. Judgment and thought content normal.          Assessment & Plan:  Routine general medical examination at a health care facility - Plan: CBC with Differential/Platelet, COMPLETE METABOLIC  PANEL WITH GFR, Lipid panel, TSH  History of tobacco use  Subclinical hypothyroidism - Plan: TSH  Her mammogram is up to date.  Pap was performed today and sent to pathology.  Recommended weight watchers and 1200-1500 cal/day diet along with 30 min a day 5 days a week aerobic exercise.  Check cbc, cmp, flp, and tsh.

## 2016-12-28 ENCOUNTER — Encounter: Payer: Self-pay | Admitting: Family Medicine

## 2016-12-28 DIAGNOSIS — E038 Other specified hypothyroidism: Secondary | ICD-10-CM

## 2016-12-28 MED ORDER — LEVOTHYROXINE SODIUM 50 MCG PO TABS: 50.0000 ug | ORAL_TABLET | Freq: Every day | ORAL | 1 refills | Status: DC

## 2016-12-28 MED FILL — LEVOTHYROXINE 50 MCG TABLET: 50 | 90 days supply | Qty: 90 | Fill #0

## 2016-12-31 LAB — PAP, THIN PREP W/HPV RFLX HPV TYPE 16/18: HPV DNA HIGH RISK: NOT DETECTED

## 2017-02-21 ENCOUNTER — Other Ambulatory Visit: Payer: 59

## 2017-02-21 DIAGNOSIS — E038 Other specified hypothyroidism: Secondary | ICD-10-CM

## 2017-02-21 LAB — TSH: TSH: 6.41 mIU/L — ABNORMAL HIGH

## 2017-02-22 ENCOUNTER — Encounter: Payer: Self-pay | Admitting: Family Medicine

## 2017-02-22 MED ORDER — LEVOTHYROXINE SODIUM 88 MCG PO TABS: 88.0000 ug | ORAL_TABLET | Freq: Every day | ORAL | 3 refills | Status: DC

## 2017-02-22 MED FILL — LEVOTHYROXINE 88 MCG TABLET: 88 | 90 days supply | Qty: 90 | Fill #0

## 2017-04-18 ENCOUNTER — Other Ambulatory Visit: Payer: 59

## 2017-04-18 DIAGNOSIS — E038 Other specified hypothyroidism: Secondary | ICD-10-CM

## 2017-04-18 DIAGNOSIS — E039 Hypothyroidism, unspecified: Secondary | ICD-10-CM

## 2017-04-18 LAB — TSH: TSH: 0.99 mIU/L

## 2017-04-19 ENCOUNTER — Encounter: Payer: Self-pay | Admitting: Family Medicine

## 2017-05-20 MED FILL — LEVOTHYROXINE 88 MCG TABLET: 88 | 90 days supply | Qty: 90 | Fill #1

## 2017-08-30 MED FILL — LEVOTHYROXINE 88 MCG TABLET: 88 | 90 days supply | Qty: 90 | Fill #2

## 2017-09-04 ENCOUNTER — Ambulatory Visit (INDEPENDENT_AMBULATORY_CARE_PROVIDER_SITE_OTHER): Payer: 59 | Admitting: Family Medicine

## 2017-09-04 ENCOUNTER — Encounter: Payer: Self-pay | Admitting: Family Medicine

## 2017-09-04 ENCOUNTER — Other Ambulatory Visit: Payer: Self-pay

## 2017-09-04 VITALS — BP 136/90 | HR 70 | Temp 98.6°F | Resp 16 | Ht 62.5 in | Wt 234.0 lb

## 2017-09-04 DIAGNOSIS — R079 Chest pain, unspecified: Secondary | ICD-10-CM

## 2017-09-04 MED ORDER — ALPRAZOLAM 0.5 MG PO TABS: 0.5000 mg | ORAL_TABLET | Freq: Three times a day (TID) | ORAL | 0 refills | Status: DC | PRN

## 2017-09-04 NOTE — Progress Notes (Signed)
Subjective:    Patient ID: Karen [name], female    DOB: 11/18/1975, 41 y.o.   MRN: [id number]  HPI  Saturday night, patient developed substernal chest pain radiating into her right shoulder.  Pain was mild 3-4 on a scale of 1-10 however it seemed to worsen over time and ultimately led to shortness of breath and tremendous anxiety.  Symptoms resolved after she took some aspirin over a period of 30-40 minutes.  She has not had any other symptoms since.  She denies any shortness of breath with activity.  She denies any angina.  She denies any nausea or vomiting or diaphoresis.  She admits that the night that this occurred she was extremely stressed out and was having a difficult time sleeping.  Much of what she describes sounds like anxiety.  Is also possible she may have been experiencing some acid reflux that evening.  Her blood pressure today is adequately controlled.  Risk factors for coronary disease include smoking, borderline hypertension.  EKG today shows normal sinus rhythm with normal intervals and a normal axis and no evidence of ischemia or infarction.  There is an isolated T wave inversion in lead III and nonspecific changes in aVF Past Medical History:  Diagnosis Date  . Anxiety   . Depression   . Hypothyroidism    subclinical/borderline  . Smoker 02/23/2013  . Smoker   No past surgical history on file. Current Outpatient Medications on File Prior to Visit  Medication Sig Dispense Refill  . aspirin 81 MG tablet Take 81 mg by mouth daily.    . fish oil-omega-3 fatty acids 1000 MG capsule Take 1 g by mouth daily.    Marland Kitchen. levothyroxine (SYNTHROID, LEVOTHROID) 88 MCG tablet Take 1 tablet (88 mcg total) by mouth daily. 90 tablet 3   No current facility-administered medications on file prior to visit.    Allergies  Allergen Reactions  . Penicillins    Social History   Socioeconomic History  . Marital status: Married    Spouse name: Not on file  . Number of children: Not on file  .  Years of education: Not on file  . Highest education level: Not on file  Social Needs  . Financial resource strain: Not on file  . Food insecurity - worry: Not on file  . Food insecurity - inability: Not on file  . Transportation needs - medical: Not on file  . Transportation needs - non-medical: Not on file  Occupational History  . Not on file  Tobacco Use  . Smoking status: Former Smoker    Types: Cigarettes  . Smokeless tobacco: Former NeurosurgeonUser    Quit date: 03/25/2016  Substance and Sexual Activity  . Alcohol use: Yes    Alcohol/week: 0.0 oz    Comment: Rare  . Drug use: No  . Sexual activity: Yes  Other Topics Concern  . Not on file  Social History Narrative  . Not on file       Past Medical History:  Diagnosis Date  . Anxiety   . Depression   . Hypothyroidism    subclinical/borderline  . Smoker 02/23/2013  . Smoker    No past surgical history on file.  Current Outpatient Medications on File Prior to Visit  Medication Sig Dispense Refill  . aspirin 81 MG tablet Take 81 mg by mouth daily.    . fish oil-omega-3 fatty acids 1000 MG capsule Take 1 g by mouth daily.    Marland Kitchen. levothyroxine (SYNTHROID, LEVOTHROID)  88 MCG tablet Take 1 tablet (88 mcg total) by mouth daily. 90 tablet 3   No current facility-administered medications on file prior to visit.    Allergies  Allergen Reactions  . Penicillins    Social History   Socioeconomic History  . Marital status: Married    Spouse name: Not on file  . Number of children: Not on file  . Years of education: Not on file  . Highest education level: Not on file  Social Needs  . Financial resource strain: Not on file  . Food insecurity - worry: Not on file  . Food insecurity - inability: Not on file  . Transportation needs - medical: Not on file  . Transportation needs - non-medical: Not on file  Occupational History  . Not on file  Tobacco Use  . Smoking status: Former Smoker    Types: Cigarettes  . Smokeless  tobacco: Former NeurosurgeonUser    Quit date: 03/25/2016  Substance and Sexual Activity  . Alcohol use: Yes    Alcohol/week: 0.0 oz    Comment: Rare  . Drug use: No  . Sexual activity: Yes  Other Topics Concern  . Not on file  Social History Narrative  . Not on file   Family History  Problem Relation Age of Onset  . Diabetes Mother   . Hyperlipidemia Mother   . Hypertension Mother   . Hypertension Father      Review of Systems  All other systems reviewed and are negative.      Objective:   Physical Exam  Constitutional: She is oriented to person, place, and time. She appears well-developed and well-nourished. No distress.  Neck: No JVD present. No thyromegaly present.  Cardiovascular: Normal rate, regular rhythm, normal heart sounds and intact distal pulses. Exam reveals no gallop and no friction rub.  No murmur heard. Pulmonary/Chest: Effort normal and breath sounds normal. No respiratory distress. She has no wheezes. She has no rales. She exhibits no tenderness.  Abdominal: Soft. Bowel sounds are normal. She exhibits no distension. There is no tenderness. There is no rebound and no guarding.  Musculoskeletal: She exhibits no edema.  Neurological: She is alert and oriented to person, place, and time. She has normal reflexes. No cranial nerve deficit. She exhibits normal muscle tone. Coordination normal.  Skin: She is not diaphoretic.  Psychiatric: She has a normal mood and affect. Her behavior is normal. Judgment and thought content normal.          Assessment & Plan:  Chest pain, unspecified type - Plan: EKG 12-Lead, DG Chest 2 View EKG today is unremarkable.  I would like to obtain a chest x-ray to complete the workup.  I suspect that the patient may have had reflux and subsequently developed a panic attack when she experienced the chest discomfort.  I have recommended trying over-the-counter medication for acid reflux and using Xanax 0.5 mg if the symptoms occur again at night.   If the symptoms do not improve dramatically over 15 minutes, I recommended calling 911 and going to the hospital.  However her history is very atypical for coronary disease.  Patient would feel comfortable seeing a cardiologist for an outpatient exercise treadmill test to be thorough giving her smoking history.  I believe this is reasonable.  However I still believe the majority of this was likely anxiety related

## 2017-09-06 ENCOUNTER — Ambulatory Visit (HOSPITAL_COMMUNITY)
Admission: RE | Admit: 2017-09-06 | Discharge: 2017-09-06 | Disposition: A | Discharge status: Home / Self Care | Payer: 59 | Source: Ambulatory Visit | Attending: Family Medicine | Admitting: Family Medicine

## 2017-09-06 DIAGNOSIS — R079 Chest pain, unspecified: Secondary | ICD-10-CM | POA: Insufficient documentation

## 2017-09-06 DIAGNOSIS — R0602 Shortness of breath: Secondary | ICD-10-CM | POA: Diagnosis not present

## 2017-09-10 ENCOUNTER — Encounter: Payer: Self-pay | Admitting: Family Medicine

## 2017-09-17 MED FILL — ALPRAZolam 0.5 MG TABS: 0.5 | 3 days supply | Qty: 10 | Fill #0

## 2017-09-18 ENCOUNTER — Encounter: Payer: Self-pay | Admitting: Cardiology

## 2017-09-18 ENCOUNTER — Other Ambulatory Visit: Payer: Self-pay

## 2017-09-18 ENCOUNTER — Ambulatory Visit: Payer: 59 | Admitting: Cardiology

## 2017-09-18 VITALS — BP 144/82 | HR 77 | Ht 63.0 in | Wt 232.0 lb

## 2017-09-18 DIAGNOSIS — R0789 Other chest pain: Secondary | ICD-10-CM

## 2017-09-18 NOTE — Progress Notes (Signed)
Clinical Summary [name] is a 41 y.o.female seen as new consult, referred by Dr Bernerd PhoPicarkd for chest pain.   1. Chest pain - isolated episode about 2 weeks ago. Occurred at home while laying in bed. Pressure like pain right shoulder into midchest, 4-5/10. +SOB. No other associated symptoms. Breathing felt constricted. Not positional. Lasted about 1 hour consistent. Took bc powder, symptoms resolved - no recurrent symptoms - no recent DOE. Does treadmill once a week x 30 minutes at slow jog, tolerates without troubles.   CAD risk factors: +former tobacco x 15 years     Past Medical History:  Diagnosis Date  . Anxiety   . Depression   . Hypothyroidism    subclinical/borderline  . Smoker 02/23/2013  . Smoker      Allergies  Allergen Reactions  . Penicillins      Current Outpatient Medications  Medication Sig Dispense Refill  . ALPRAZolam (XANAX) 0.5 MG tablet Take 1 tablet (0.5 mg total) by mouth 3 (three) times daily as needed for anxiety. 10 tablet 0  . aspirin 81 MG tablet Take 81 mg by mouth daily.    . fish oil-omega-3 fatty acids 1000 MG capsule Take 1 g by mouth daily.    Marland Kitchen. levothyroxine (SYNTHROID, LEVOTHROID) 88 MCG tablet Take 1 tablet (88 mcg total) by mouth daily. 90 tablet 3   No current facility-administered medications for this visit.      No past surgical history on file.   Allergies  Allergen Reactions  . Penicillins       Family History  Problem Relation Age of Onset  . Diabetes Mother   . Hyperlipidemia Mother   . Hypertension Mother   . Hypertension Father      Social History [name] reports that she has quit smoking. Her smoking use included cigarettes. She quit smokeless tobacco use about 17 months ago. [name] reports that she drinks alcohol.   Review of Systems CONSTITUTIONAL: No weight loss, fever, chills, weakness or fatigue.  HEENT: Eyes: No visual loss, blurred vision, double vision or yellow sclerae.No hearing  loss, sneezing, congestion, runny nose or sore throat.  SKIN: No rash or itching.  CARDIOVASCULAR: per hpi RESPIRATORY: per hpi GASTROINTESTINAL: No anorexia, nausea, vomiting or diarrhea. No abdominal pain or blood.  GENITOURINARY: No burning on urination, no polyuria NEUROLOGICAL: No headache, dizziness, syncope, paralysis, ataxia, numbness or tingling in the extremities. No change in bowel or bladder control.  MUSCULOSKELETAL: No muscle, back pain, joint pain or stiffness.  LYMPHATICS: No enlarged nodes. No history of splenectomy.  PSYCHIATRIC: No history of depression or anxiety.  ENDOCRINOLOGIC: No reports of sweating, cold or heat intolerance. No polyuria or polydipsia.  Marland Kitchen.   Physical Examination Vitals:   09/18/17 1001  BP: (!) 144/82  Pulse: 77  SpO2: 97%   Vitals:   09/18/17 1001  Weight: 232 lb (105.2 kg)  Height: 5\' 3"  (1.6 m)    Gen: resting comfortably, no acute distress HEENT: no scleral icterus, pupils equal round and reactive, no palptable cervical adenopathy,  CV: RRR, no m/r/g, no jvd Resp: Clear to auscultation bilaterally GI: abdomen is soft, non-tender, non-distended, normal bowel sounds, no hepatosplenomegaly MSK: extremities are warm, no edema.  Skin: warm, no rash Neuro:  no focal deficits Psych: appropriate affect     Assessment and Plan  1. Atypical chest pain - symptoms not consistent with cardiac chest pain - conitnue to monitor at this time, no plans for ischemic testing  F/u 4 months      Antoine PocheJonathan F. Branch, M.D., F.A.C.C.

## 2017-09-18 NOTE — Patient Instructions (Signed)
Your physician recommends that you schedule a follow-up appointment in: 4 MONTHS WITH DR BRANCH  Your physician recommends that you continue on your current medications as directed. Please refer to the Current Medication list given to you today.  Thank you for choosing [facility]!!    

## 2017-09-21 ENCOUNTER — Encounter: Payer: Self-pay | Admitting: Cardiology

## 2017-10-25 ENCOUNTER — Telehealth: Payer: 59 | Admitting: Family

## 2017-10-25 DIAGNOSIS — L738 Other specified follicular disorders: Secondary | ICD-10-CM

## 2017-10-25 MED ORDER — DOXYCYCLINE HYCLATE 100 MG PO TABS: 100.0000 mg | ORAL_TABLET | Freq: Two times a day (BID) | ORAL | 0 refills | Status: DC

## 2017-10-25 MED FILL — DOXYCYCLINE HYCLATE 100 MG: 100 | 7 days supply | Qty: 14 | Fill #0

## 2017-10-25 NOTE — Progress Notes (Signed)
Thank you for the details you included in the comment boxes. Those details are very helpful in determining the best course of treatment for you and help us to provide the best care. If it has been going on since September, you need to be seen face-to-face with a dermatologist. There is no better solution than that. However, I can send in a temporary treatment that may help you. If this does not help, and it may not, it is time to see an expert (dermatologist) as they have certain medications we do not have access to.  E Visit for Rash  We are sorry that you are not feeling well. Here is how we plan to help!    Based upon what you have shared with me it looks like you have a bacterial follicultits.  Folliculitis is inflammation of the hair follicles that can be caused by a superficial infection of the skin and is treated with an antibiotic. I have prescribed: and Doxycycline 100 mg twice per day for 7 days   If it has been since September, the oral antibiotic would be best as the bacteria is deeper in the follicles where creams may not penetrate.   HOME CARE:   Take cool showers and avoid direct sunlight.  Apply cool compress or wet dressings.  Take a bath in an oatmeal bath.  Sprinkle content of one Aveeno packet under running faucet with comfortably warm water.  Bathe for 15-20 minutes, 1-2 times daily.  Pat dry with a towel. Do not rub the rash.  Use hydrocortisone cream.  Take an antihistamine like Benadryl for widespread rashes that itch.  The adult dose of Benadryl is 25-50 mg by mouth 4 times daily.  Caution:  This type of medication may cause sleepiness.  Do not drink alcohol, drive, or operate dangerous machinery while taking antihistamines.  Do not take these medications if you have prostate enlargement.  Read package instructions thoroughly on all medications that you take.  GET HELP RIGHT AWAY IF:   Symptoms don't go away after treatment.  Severe itching that  persists.  If you rash spreads or swells.  If you rash begins to smell.  If it blisters and opens or develops a yellow-brown crust.  You develop a fever.  You have a sore throat.  You become short of breath.  MAKE SURE YOU:  Understand these instructions. Will watch your condition. Will get help right away if you are not doing well or get worse.  Thank you for choosing an e-visit. Your e-visit answers were reviewed by a board certified advanced clinical practitioner to complete your personal care plan. Depending upon the condition, your plan could have included both over the counter or prescription medications. Please review your pharmacy choice. Be sure that the pharmacy you have chosen is open so that you can pick up your prescription now.  If there is a problem you may message your provider in MyChart to have the prescription routed to another pharmacy. Your safety is important to us. If you have drug allergies check your prescription carefully.  For the next 24 hours, you can use MyChart to ask questions about today's visit, request a non-urgent call back, or ask for a work or school excuse from your e-visit provider. You will get an email in the next two days asking about your experience. I hope that your e-visit has been valuable and will speed your recovery.

## 2017-12-16 MED FILL — LEVOTHYROXINE 88 MCG TABLET: 88 | 90 days supply | Qty: 90 | Fill #3

## 2018-01-13 ENCOUNTER — Encounter: Payer: 59 | Admitting: Family Medicine

## 2018-01-13 ENCOUNTER — Ambulatory Visit (INDEPENDENT_AMBULATORY_CARE_PROVIDER_SITE_OTHER): Payer: No Typology Code available for payment source | Admitting: Family Medicine

## 2018-01-13 ENCOUNTER — Encounter: Payer: Self-pay | Admitting: Family Medicine

## 2018-01-13 VITALS — BP 136/98 | HR 80 | Temp 98.1°F | Resp 16 | Ht 62.5 in | Wt 227.0 lb

## 2018-01-13 DIAGNOSIS — E038 Other specified hypothyroidism: Secondary | ICD-10-CM

## 2018-01-13 DIAGNOSIS — Z1239 Encounter for other screening for malignant neoplasm of breast: Secondary | ICD-10-CM

## 2018-01-13 DIAGNOSIS — Z1231 Encounter for screening mammogram for malignant neoplasm of breast: Secondary | ICD-10-CM

## 2018-01-13 DIAGNOSIS — Z87891 Personal history of nicotine dependence: Secondary | ICD-10-CM | POA: Diagnosis not present

## 2018-01-13 DIAGNOSIS — Z0000 Encounter for general adult medical examination without abnormal findings: Secondary | ICD-10-CM

## 2018-01-13 MED ORDER — LIRAGLUTIDE -WEIGHT MANAGEMENT 18 MG/3ML ~~LOC~~ SOPN: 0.6000 mg | PEN_INJECTOR | Freq: Every day | SUBCUTANEOUS | 11 refills | Status: DC

## 2018-01-13 MED ORDER — INSULIN PEN NEEDLE 32G X 6 MM MISC: 1 refills | Status: DC

## 2018-01-13 NOTE — Progress Notes (Signed)
Subjective:    Patient ID: Karen [name], female    DOB: May 06, 1976, 42 y.o.   MRN: [id number]  HPI  Here for CPE.   Her last pap was 2018.  Last mammogram was 2017. Continues to refrain from cigarettes.  Continues to battle episodic intertrigo in the creases between her legs and her perineum bilaterally as well as under the pannus just above her pubic area.  She has been treated multiple times with Lotrimin cream.  She has even developed a bacterial folliculitis that responded to doxycycline in that area.  She is interested in treatment options to help prevent this  Past Medical History:  Diagnosis Date  . Anxiety   . Depression   . Hypothyroidism    subclinical/borderline  . Smoker 02/23/2013  . Smoker    Past Surgical History:  Procedure Laterality Date  . CHOLECYSTECTOMY, LAPAROSCOPIC      Current Outpatient Medications on File Prior to Visit  Medication Sig Dispense Refill  . ALPRAZolam (XANAX) 0.5 MG tablet Take 1 tablet (0.5 mg total) by mouth 3 (three) times daily as needed for anxiety. 10 tablet 0  . aspirin 81 MG tablet Take 81 mg by mouth daily.    . fish oil-omega-3 fatty acids 1000 MG capsule Take 1 g by mouth daily.    Marland Kitchen levothyroxine (SYNTHROID, LEVOTHROID) 88 MCG tablet Take 1 tablet (88 mcg total) by mouth daily. 90 tablet 3   No current facility-administered medications on file prior to visit.    Allergies  Allergen Reactions  . Penicillins    Social History   Socioeconomic History  . Marital status: Married    Spouse name: Not on file  . Number of children: Not on file  . Years of education: Not on file  . Highest education level: Not on file  Occupational History  . Not on file  Social Needs  . Financial resource strain: Not on file  . Food insecurity:    Worry: Not on file    Inability: Not on file  . Transportation needs:    Medical: Not on file    Non-medical: Not on file  Tobacco Use  . Smoking status: Former Smoker    Types: Cigarettes    . Smokeless tobacco: Former Neurosurgeon    Quit date: 03/25/2016  Substance and Sexual Activity  . Alcohol use: Yes    Alcohol/week: 0.0 oz    Comment: Rare  . Drug use: No  . Sexual activity: Yes  Lifestyle  . Physical activity:    Days per week: Not on file    Minutes per session: Not on file  . Stress: Not on file  Relationships  . Social connections:    Talks on phone: Not on file    Gets together: Not on file    Attends religious service: Not on file    Active member of club or organization: Not on file    Attends meetings of clubs or organizations: Not on file    Relationship status: Not on file  . Intimate partner violence:    Fear of current or ex partner: Not on file    Emotionally abused: Not on file    Physically abused: Not on file    Forced sexual activity: Not on file  Other Topics Concern  . Not on file  Social History Narrative  . Not on file   Family History  Problem Relation Age of Onset  . Diabetes Mother   . Hyperlipidemia Mother   .  Hypertension Mother   . Hypertension Father      Review of Systems     Objective:   Physical Exam  Constitutional: She is oriented to person, place, and time. She appears well-developed and well-nourished. No distress.  HENT:  Head: Normocephalic and atraumatic.  Right Ear: External ear normal.  Left Ear: External ear normal.  Nose: Nose normal.  Mouth/Throat: Oropharynx is clear and moist. No oropharyngeal exudate.  Eyes: Pupils are equal, round, and reactive to light. Conjunctivae and EOM are normal. Right eye exhibits no discharge. Left eye exhibits no discharge. No scleral icterus.  Neck: Normal range of motion. Neck supple. No JVD present. No tracheal deviation present. No thyromegaly present.  Cardiovascular: Normal rate, regular rhythm, normal heart sounds and intact distal pulses. Exam reveals no gallop and no friction rub.  No murmur heard. Pulmonary/Chest: Effort normal and breath sounds normal. No stridor.  No respiratory distress. She has no wheezes. She has no rales. She exhibits no tenderness.  Abdominal: Soft. Bowel sounds are normal. She exhibits no distension. There is no tenderness. There is no rebound and no guarding.    Genitourinary:     Musculoskeletal: Normal range of motion. She exhibits no edema, tenderness or deformity.  Lymphadenopathy:    She has no cervical adenopathy.  Neurological: She is alert and oriented to person, place, and time. She has normal reflexes. No cranial nerve deficit. She exhibits normal muscle tone. Coordination normal.  Skin: Skin is warm. Rash noted. She is not diaphoretic. There is erythema. No pallor.     Psychiatric: She has a normal mood and affect. Her behavior is normal. Judgment and thought content normal.          Assessment & Plan:  General medical exam - Plan: CBC with Differential/Platelet, COMPLETE METABOLIC PANEL WITH GFR, Lipid panel, TSH  Other specified hypothyroidism  History of tobacco use  Morbid obesity (HCC)  I will schedule the patient for mammogram.  I will check a CBC, CMP, fasting lipid panel, and TSH.  Her blood pressure is elevated today.  She is battling recurrent intertrigo.  I believe both of these issues are related to her morbid obesity and her BMI greater than 40.  I have recommended substantial weight loss and also discussed hygiene strategies to help prevent intertrigo including drying thoroughly after bathing, using topical powders, etc.  I have recommended Saxenda 0.6 mg daily and uptitrating to 3 mg daily as tolerated for weight loss.  Also continue to encourage therapeutic lifestyle changes including diet exercise and weight loss

## 2018-01-14 ENCOUNTER — Encounter (INDEPENDENT_AMBULATORY_CARE_PROVIDER_SITE_OTHER): Payer: Self-pay

## 2018-01-14 ENCOUNTER — Telehealth: Payer: Self-pay | Admitting: *Deleted

## 2018-01-14 LAB — COMPLETE METABOLIC PANEL WITH GFR
AG Ratio: 1.6 (calc) (ref 1.0–2.5)
ALT: 12 U/L (ref 6–29)
AST: 12 U/L (ref 10–30)
Albumin: 4.3 g/dL (ref 3.6–5.1)
Alkaline phosphatase (APISO): 65 U/L (ref 33–115)
BUN: 9 mg/dL (ref 7–25)
CALCIUM: 9.5 mg/dL (ref 8.6–10.2)
CO2: 22 mmol/L (ref 20–32)
CREATININE: 0.68 mg/dL (ref 0.50–1.10)
Chloride: 106 mmol/L (ref 98–110)
GFR, EST NON AFRICAN AMERICAN: 109 mL/min/{1.73_m2} (ref >=60)
GFR, Est African American: 126 mL/min/{1.73_m2} (ref >=60)
Globulin: 2.7 g/dL (calc) (ref 1.9–3.7)
Glucose, Bld: 98 mg/dL (ref 65–99)
POTASSIUM: 3.9 mmol/L (ref 3.5–5.3)
Sodium: 139 mmol/L (ref 135–146)
Total Bilirubin: 0.5 mg/dL (ref 0.2–1.2)
Total Protein: 7 g/dL (ref 6.1–8.1)

## 2018-01-14 LAB — LIPID PANEL
Cholesterol: 183 mg/dL (ref <200)
HDL: 48 mg/dL — ABNORMAL LOW (ref >50)
LDL Cholesterol (Calc): 112 mg/dL (calc) — ABNORMAL HIGH
NON-HDL CHOLESTEROL (CALC): 135 mg/dL — AB (ref <130)
TRIGLYCERIDES: 115 mg/dL (ref <150)
Total CHOL/HDL Ratio: 3.8 (calc) (ref <5.0)

## 2018-01-14 LAB — CBC WITH DIFFERENTIAL/PLATELET
Basophils Absolute: 32 cells/uL (ref 0–200)
Basophils Relative: 0.6 %
EOS PCT: 3 %
Eosinophils Absolute: 162 cells/uL (ref 15–500)
HCT: 41.8 % (ref 35.0–45.0)
Hemoglobin: 14.3 g/dL (ref 11.7–15.5)
Lymphs Abs: 1955 cells/uL (ref 850–3900)
MCH: 28.3 pg (ref 27.0–33.0)
MCHC: 34.2 g/dL (ref 32.0–36.0)
MCV: 82.8 fL (ref 80.0–100.0)
MONOS PCT: 9.8 %
MPV: 10.7 fL (ref 7.5–12.5)
NEUTROS PCT: 50.4 %
Neutro Abs: 2722 cells/uL (ref 1500–7800)
PLATELETS: 289 10*3/uL (ref 140–400)
RBC: 5.05 10*6/uL (ref 3.80–5.10)
RDW: 13.1 % (ref 11.0–15.0)
TOTAL LYMPHOCYTE: 36.2 %
WBC mixed population: 529 cells/uL (ref 200–950)
WBC: 5.4 10*3/uL (ref 3.8–10.8)

## 2018-01-14 LAB — TSH: TSH: 1.76 mIU/L

## 2018-01-14 NOTE — Telephone Encounter (Signed)
Received request from pharmacy for PA on Saxenda.   PA submitted.   Dx: E66.09- obesity   

## 2018-01-14 NOTE — Telephone Encounter (Signed)
Your information has been sent to [employer]. Please wait for [employer] to return a determination. 

## 2018-01-16 MED ORDER — LIRAGLUTIDE -WEIGHT MANAGEMENT 18 MG/3ML ~~LOC~~ SOPN: 0.6000 mg | PEN_INJECTOR | Freq: Every day | SUBCUTANEOUS | 11 refills | Status: DC

## 2018-01-16 MED FILL — UNIFINE PENTIPS 31GX3/16": 31G X 5 MM | 90 days supply | Qty: 100 | Fill #0

## 2018-01-16 MED FILL — UNIFINE PENTIPS 31GX3/16: 31G X 5 MM | 90 days supply | Qty: 100 | Fill #0

## 2018-01-16 NOTE — Telephone Encounter (Signed)
Received PA determination.   PA Approved. Pharmacy made aware.   The request has been approved. The authorization is effective for a maximum of 4 fills from 01/13/2018 to 05/17/2018, as long as the member is enrolled in their current health plan. The request was approved as submitted. This request has been approved for 15mL per 30 days. Please note: Renewal for Saxenda requires the patient has lost at least 4% of baseline body weight after 4 months of treatment. A written notification letter will follow with additional details.

## 2018-01-23 MED FILL — SAXENDA 18 MG/3 ML PEN: 18 | 30 days supply | Qty: 15 | Fill #0

## 2018-03-11 ENCOUNTER — Other Ambulatory Visit: Payer: Self-pay | Admitting: Family Medicine

## 2018-03-11 MED FILL — SAXENDA 18 MG/3 ML PEN: 18 | 30 days supply | Qty: 15 | Fill #1

## 2018-03-11 MED FILL — LEVOTHYROXINE 88 MCG TABLET: 88 | 90 days supply | Qty: 90 | Fill #0

## 2018-03-17 ENCOUNTER — Ambulatory Visit
Admission: RE | Admit: 2018-03-17 | Discharge: 2018-03-17 | Disposition: A | Discharge status: Home / Self Care | Payer: 59 | Source: Ambulatory Visit | Attending: Family Medicine | Admitting: Family Medicine

## 2018-03-17 DIAGNOSIS — Z1239 Encounter for other screening for malignant neoplasm of breast: Secondary | ICD-10-CM

## 2018-04-08 MED FILL — UNIFINE PENTIPS 31GX3/16: 31G X 5 MM | 90 days supply | Qty: 100 | Fill #1

## 2018-04-08 MED FILL — SAXENDA 18 MG/3 ML PEN: 18 | 30 days supply | Qty: 15 | Fill #2

## 2018-04-08 MED FILL — UNIFINE PENTIPS 31GX3/16": 31G X 5 MM | 90 days supply | Qty: 100 | Fill #1

## 2018-05-09 ENCOUNTER — Encounter: Payer: Self-pay | Admitting: *Deleted

## 2018-05-12 MED FILL — SAXENDA 18 MG/3 ML PEN: 18 | 30 days supply | Qty: 15 | Fill #3

## 2018-05-13 ENCOUNTER — Ambulatory Visit: Payer: No Typology Code available for payment source

## 2018-05-13 NOTE — Progress Notes (Signed)
Patient was in office for weight check for her saxenda prescription renewal. Patient weight has dropped to 199.6 lb

## 2018-05-22 ENCOUNTER — Telehealth: Payer: Self-pay | Admitting: *Deleted

## 2018-05-22 NOTE — Telephone Encounter (Signed)
Your PA has been faxed to the plan as a paper copy. Please contact the plan directly if you haven't received a determination in a typical timeframe.  You will be notified of the determination electronically and via fax. 

## 2018-05-22 NOTE — Telephone Encounter (Signed)
Received request from pharmacy for PA on Saxenda.  PA submitted.   Dx: E66.01- obesity.   4/1 weight: 227lbs 7/30 weight: 199lbs % weight loss: 12.33%

## 2018-05-28 MED ORDER — LIRAGLUTIDE -WEIGHT MANAGEMENT 18 MG/3ML ~~LOC~~ SOPN: 0.6000 mg | PEN_INJECTOR | Freq: Every day | SUBCUTANEOUS | 11 refills | Status: DC

## 2018-05-28 NOTE — Telephone Encounter (Addendum)
Received PA determination.   PA [id number] approved 05/18/2018- 05/18/2019.  Pharmacy made aware.

## 2018-05-28 NOTE — Addendum Note (Signed)
Addended by: Phillips OdorSIX, CHRISTINA H on: 05/28/2018 08:35 AM   Modules accepted: Orders

## 2018-06-25 MED FILL — SAXENDA 18 MG/3 ML PEN: 18 | 30 days supply | Qty: 15 | Fill #4

## 2018-06-25 MED FILL — LEVOTHYROXINE 88 MCG TABLET: 88 | 90 days supply | Qty: 90 | Fill #1

## 2018-07-02 ENCOUNTER — Encounter: Payer: Self-pay | Admitting: Family Medicine

## 2018-07-03 ENCOUNTER — Other Ambulatory Visit: Payer: Self-pay | Admitting: Family Medicine

## 2018-07-03 MED ORDER — ALPRAZOLAM 0.5 MG PO TABS: 0.5000 mg | ORAL_TABLET | Freq: Three times a day (TID) | ORAL | 0 refills | Status: DC | PRN

## 2018-07-03 MED FILL — ALPRAZolam 0.5 MG TABS: 0.5 | 10 days supply | Qty: 30 | Fill #0

## 2018-07-21 ENCOUNTER — Other Ambulatory Visit: Payer: Self-pay | Admitting: Family Medicine

## 2018-07-21 MED FILL — SAXENDA 18 MG/3 ML PEN: 18 | 30 days supply | Qty: 15 | Fill #5

## 2018-08-27 MED FILL — SAXENDA 18 MG/3 ML PEN: 18 | 30 days supply | Qty: 15 | Fill #6

## 2018-08-27 MED FILL — UNIFINE PENTIPS 31GX3/16": 31G X 5 MM | 90 days supply | Qty: 100 | Fill #0

## 2018-08-27 MED FILL — UNIFINE PENTIPS 31GX3/16: 31G X 5 MM | 90 days supply | Qty: 100 | Fill #0

## 2018-10-27 MED FILL — SAXENDA 18 MG/3 ML PEN: 18 | 30 days supply | Qty: 15 | Fill #7

## 2018-11-17 ENCOUNTER — Encounter: Payer: Self-pay | Admitting: Family Medicine

## 2018-11-20 ENCOUNTER — Encounter: Payer: Self-pay | Admitting: Family Medicine

## 2018-11-20 ENCOUNTER — Ambulatory Visit (INDEPENDENT_AMBULATORY_CARE_PROVIDER_SITE_OTHER): Payer: No Typology Code available for payment source | Admitting: Family Medicine

## 2018-11-20 VITALS — BP 142/92 | HR 81 | Temp 98.2°F | Resp 14 | Ht 62.5 in | Wt 208.0 lb

## 2018-11-20 DIAGNOSIS — F419 Anxiety disorder, unspecified: Secondary | ICD-10-CM | POA: Diagnosis not present

## 2018-11-20 DIAGNOSIS — E039 Hypothyroidism, unspecified: Secondary | ICD-10-CM | POA: Diagnosis not present

## 2018-11-20 NOTE — Progress Notes (Signed)
Subjective:    Patient ID: Karen [name], female    DOB: 03/10/1976, 43 y.o.   MRN: [id number]  HPI  Patient states that over the last several months, her anxiety has worsened.  She went from having panic attacks once a week now to almost an every day phenomenon.  She states that she feels extremely anxious all the time.  She recently went on vacation and stopped taking levothyroxine by accident for 3 days and while she was off the medication, her anxiety attacks improved.  Therefore she has come concerned that her levothyroxine could be causing or contributing to her anxiety.  Therefore for the last month or so, the patient has been taking 44 mcg a day of levothyroxine.  Despite decreasing her dose by more than 50% she has not seen a substantial improvement in her frequency and severity of panic attacks.  However the patient is interested in switching to Synthroid or possibly Armour Thyroid as she believes that the thyroid medication is causing her anxiety attacks. Past Medical History:  Diagnosis Date  . Anxiety   . Depression   . Hypothyroidism    subclinical/borderline  . Smoker 02/23/2013  . Smoker    Past Surgical History:  Procedure Laterality Date  . CHOLECYSTECTOMY, LAPAROSCOPIC      Current Outpatient Medications on File Prior to Visit  Medication Sig Dispense Refill  . ALPRAZolam (XANAX) 0.5 MG tablet Take 1 tablet (0.5 mg total) by mouth 3 (three) times daily as needed for anxiety. 30 tablet 0  . aspirin 81 MG tablet Take 81 mg by mouth daily.    . fish oil-omega-3 fatty acids 1000 MG capsule Take 1 g by mouth daily.    Marland Kitchen. levothyroxine (SYNTHROID, LEVOTHROID) 88 MCG tablet TAKE 1 TABLET BY MOUTH DAILY. (Patient taking differently: 44 mcg. ) 90 tablet 3  . Liraglutide -Weight Management (SAXENDA) 18 MG/3ML SOPN Inject 0.6 mg into the skin daily. Taper up by 0.6mg  weekly until max dose of 3mg  is reached. 5 pen 11  . UNIFINE PENTIPS 31G X 5 MM MISC USE AS DIRECTED TO INJECT  SAXENDA UNDER THE SKIN DAILY 100 each 1   No current facility-administered medications on file prior to visit.    Allergies  Allergen Reactions  . Penicillins    Social History   Socioeconomic History  . Marital status: Married    Spouse name: Not on file  . Number of children: Not on file  . Years of education: Not on file  . Highest education level: Not on file  Occupational History  . Not on file  Social Needs  . Financial resource strain: Not on file  . Food insecurity:    Worry: Not on file    Inability: Not on file  . Transportation needs:    Medical: Not on file    Non-medical: Not on file  Tobacco Use  . Smoking status: Former Smoker    Types: Cigarettes  . Smokeless tobacco: Former NeurosurgeonUser    Quit date: 03/25/2016  Substance and Sexual Activity  . Alcohol use: Yes    Alcohol/week: 0.0 standard drinks    Comment: Rare  . Drug use: No  . Sexual activity: Yes  Lifestyle  . Physical activity:    Days per week: Not on file    Minutes per session: Not on file  . Stress: Not on file  Relationships  . Social connections:    Talks on phone: Not on file    Gets  together: Not on file    Attends religious service: Not on file    Active member of club or organization: Not on file    Attends meetings of clubs or organizations: Not on file    Relationship status: Not on file  . Intimate partner violence:    Fear of current or ex partner: Not on file    Emotionally abused: Not on file    Physically abused: Not on file    Forced sexual activity: Not on file  Other Topics Concern  . Not on file  Social History Narrative  . Not on file   Family History  Problem Relation Age of Onset  . Diabetes Mother   . Hyperlipidemia Mother   . Hypertension Mother   . Hypertension Father      Review of Systems  All other systems reviewed and are negative.      Objective:   Physical Exam  Constitutional: She is oriented to person, place, and time. She appears  well-developed and well-nourished. No distress.  Neck: No JVD present. No thyromegaly present.  Cardiovascular: Normal rate, regular rhythm, normal heart sounds and intact distal pulses. Exam reveals no gallop and no friction rub.  No murmur heard. Pulmonary/Chest: Effort normal and breath sounds normal. No respiratory distress. She has no wheezes. She has no rales. She exhibits no tenderness.  Abdominal: Soft. Bowel sounds are normal. She exhibits no distension. There is no abdominal tenderness. There is no rebound and no guarding.  Musculoskeletal:        General: No edema.  Neurological: She is alert and oriented to person, place, and time. She has normal reflexes. No cranial nerve deficit. She exhibits normal muscle tone. Coordination normal.  Skin: She is not diaphoretic.  Psychiatric: She has a normal mood and affect. Her behavior is normal. Judgment and thought content normal.          Assessment & Plan:  Hypothyroidism, adult - Plan: TSH, T3, Free, T4, free, CBC with Differential/Platelet, COMPLETE METABOLIC PANEL WITH GFR  Anxiety  I believe the patient has panic disorder coupled with generalized anxiety disorder.  I recommend starting an SSRI or other medication to help control and prevent panic attacks rather than switching levothyroxine.  I will check a TSH as a suspect that since she has decreased her dose to 44 mcg it will likely be elevated.  I will also check a free T3 and free T4.  Based on these lab results, we may need to increase her levothyroxine.  We can certainly switch the patient to the equivalent dose of Synthroid if this makes her more comfortable however my honest opinion is that the thyroid medication is not contributing to the panic attacks but this is rather a physical symptom of her underlying panic disorder

## 2018-11-21 ENCOUNTER — Other Ambulatory Visit: Payer: Self-pay | Admitting: Family Medicine

## 2018-11-21 ENCOUNTER — Encounter: Payer: Self-pay | Admitting: Family Medicine

## 2018-11-21 LAB — COMPLETE METABOLIC PANEL WITH GFR
AG Ratio: 1.4 (calc) (ref 1.0–2.5)
ALT: 16 U/L (ref 6–29)
AST: 15 U/L (ref 10–30)
Albumin: 4.2 g/dL (ref 3.6–5.1)
Alkaline phosphatase (APISO): 69 U/L (ref 31–125)
BUN: 15 mg/dL (ref 7–25)
CO2: 24 mmol/L (ref 20–32)
CREATININE: 0.83 mg/dL (ref 0.50–1.10)
Calcium: 9.7 mg/dL (ref 8.6–10.2)
Chloride: 104 mmol/L (ref 98–110)
GFR, EST AFRICAN AMERICAN: 101 mL/min/{1.73_m2} (ref >=60)
GFR, Est Non African American: 87 mL/min/{1.73_m2} (ref >=60)
Globulin: 2.9 g/dL (calc) (ref 1.9–3.7)
Glucose, Bld: 81 mg/dL (ref 65–99)
Potassium: 3.9 mmol/L (ref 3.5–5.3)
Sodium: 139 mmol/L (ref 135–146)
TOTAL PROTEIN: 7.1 g/dL (ref 6.1–8.1)
Total Bilirubin: 0.4 mg/dL (ref 0.2–1.2)

## 2018-11-21 LAB — CBC WITH DIFFERENTIAL/PLATELET
Absolute Monocytes: 490 cells/uL (ref 200–950)
BASOS PCT: 0.7 %
Basophils Absolute: 48 cells/uL (ref 0–200)
EOS PCT: 3.7 %
Eosinophils Absolute: 252 cells/uL (ref 15–500)
HCT: 46.1 % — ABNORMAL HIGH (ref 35.0–45.0)
HEMOGLOBIN: 15.5 g/dL (ref 11.7–15.5)
Lymphs Abs: 3434 cells/uL (ref 850–3900)
MCH: 28.7 pg (ref 27.0–33.0)
MCHC: 33.6 g/dL (ref 32.0–36.0)
MCV: 85.4 fL (ref 80.0–100.0)
MONOS PCT: 7.2 %
MPV: 9.8 fL (ref 7.5–12.5)
Neutro Abs: 2577 cells/uL (ref 1500–7800)
Neutrophils Relative %: 37.9 %
Platelets: 313 10*3/uL (ref 140–400)
RBC: 5.4 10*6/uL — ABNORMAL HIGH (ref 3.80–5.10)
RDW: 12.7 % (ref 11.0–15.0)
Total Lymphocyte: 50.5 %
WBC: 6.8 10*3/uL (ref 3.8–10.8)

## 2018-11-21 LAB — TSH: TSH: 7.58 mIU/L — ABNORMAL HIGH

## 2018-11-21 LAB — T4, FREE: Free T4: 1 ng/dL (ref 0.8–1.8)

## 2018-11-21 LAB — T3, FREE: T3 FREE: 3 pg/mL (ref 2.3–4.2)

## 2018-11-21 MED ORDER — SERTRALINE HCL 50 MG PO TABS: 50.0000 mg | ORAL_TABLET | Freq: Every day | ORAL | 3 refills | Status: DC

## 2018-11-21 MED ORDER — LEVOTHYROXINE SODIUM 75 MCG PO TABS: 75.0000 ug | ORAL_TABLET | Freq: Every day | ORAL | 3 refills | Status: DC

## 2018-11-21 MED FILL — SYNTHROID 75 MCG TABLET: 75 | 30 days supply | Qty: 30 | Fill #0

## 2018-11-21 MED FILL — SERTRALINE HCL 50 MG TABLET: 50 | 30 days supply | Qty: 30 | Fill #0

## 2018-11-24 MED FILL — SAXENDA 18 MG/3 ML PEN: 18 | 30 days supply | Qty: 15 | Fill #8

## 2018-11-24 MED FILL — UNIFINE PENTIPS 31GX3/16: 31G X 5 MM | 90 days supply | Qty: 100 | Fill #1

## 2018-11-24 MED FILL — UNIFINE PENTIPS 31GX3/16": 31G X 5 MM | 90 days supply | Qty: 100 | Fill #1

## 2018-12-22 MED FILL — SAXENDA 18 MG/3 ML PEN: 18 | 30 days supply | Qty: 15 | Fill #9

## 2018-12-22 MED FILL — SYNTHROID 75 MCG TABLET: 75 | 30 days supply | Qty: 30 | Fill #1

## 2018-12-22 MED FILL — SERTRALINE HCL 50 MG TABLET: 50 | 30 days supply | Qty: 30 | Fill #1 | Status: TO

## 2019-01-20 ENCOUNTER — Other Ambulatory Visit: Payer: Self-pay | Admitting: *Deleted

## 2019-01-20 MED ORDER — SERTRALINE HCL 50 MG PO TABS: 50.0000 mg | ORAL_TABLET | Freq: Every day | ORAL | 3 refills | Status: DC

## 2019-01-20 MED FILL — SERTRALINE HCL 50 MG TABLET: 50 | 30 days supply | Qty: 30 | Fill #0

## 2019-02-19 ENCOUNTER — Encounter: Payer: Self-pay | Admitting: Family Medicine

## 2019-02-19 ENCOUNTER — Ambulatory Visit (INDEPENDENT_AMBULATORY_CARE_PROVIDER_SITE_OTHER): Payer: No Typology Code available for payment source | Admitting: Family Medicine

## 2019-02-19 ENCOUNTER — Other Ambulatory Visit: Payer: Self-pay

## 2019-02-19 VITALS — BP 130/84 | HR 82 | Temp 99.2°F | Resp 14 | Ht 62.5 in | Wt 222.0 lb

## 2019-02-19 DIAGNOSIS — Z0000 Encounter for general adult medical examination without abnormal findings: Secondary | ICD-10-CM

## 2019-02-19 DIAGNOSIS — Z87891 Personal history of nicotine dependence: Secondary | ICD-10-CM

## 2019-02-19 DIAGNOSIS — F419 Anxiety disorder, unspecified: Secondary | ICD-10-CM

## 2019-02-19 DIAGNOSIS — E038 Other specified hypothyroidism: Secondary | ICD-10-CM

## 2019-02-19 DIAGNOSIS — Z0001 Encounter for general adult medical examination with abnormal findings: Secondary | ICD-10-CM | POA: Diagnosis not present

## 2019-02-19 LAB — TSH: TSH: 2.04 mIU/L

## 2019-02-19 LAB — LIPID PANEL
Cholesterol: 206 mg/dL — ABNORMAL HIGH (ref <200)
HDL: 59 mg/dL (ref >=50)
LDL Cholesterol (Calc): 128 mg/dL (calc) — ABNORMAL HIGH
Non-HDL Cholesterol (Calc): 147 mg/dL (calc) — ABNORMAL HIGH (ref <130)
Total CHOL/HDL Ratio: 3.5 (calc) (ref <5.0)
Triglycerides: 93 mg/dL (ref <150)

## 2019-02-19 MED ORDER — ESCITALOPRAM OXALATE 10 MG PO TABS: 10.0000 mg | ORAL_TABLET | Freq: Every day | ORAL | 3 refills | Status: DC

## 2019-02-19 MED ORDER — LEVOTHYROXINE SODIUM 75 MCG PO TABS: 75.0000 ug | ORAL_TABLET | Freq: Every day | ORAL | 3 refills | Status: DC

## 2019-02-19 MED FILL — LEVOTHYROXINE 75 MCG TABLET: 75 | 90 days supply | Qty: 90 | Fill #0

## 2019-02-19 MED FILL — ESCITALOPRAM 10 MG TABLET: 10 | 90 days supply | Qty: 90 | Fill #0

## 2019-02-19 NOTE — Progress Notes (Signed)
Subjective:    Patient ID: Karen [name], female    DOB: 01/12/1976, 43 y.o.   MRN: [id number]  HPI  Patient is here today for a physical exam.  She is seen benefit on Lexapro.  She switched to Zoloft due to sexual side effects.  However she would like to switch back to Lexapro.  She felt like that medication was working better control her anxiety and she feels that she could benefit more from the Lexapro than from the Zoloft.  She states that the Zoloft is not working as well to control her anxiety as the Lexapro did.  She is interested in possibly adding Wellbutrin to augment the Lexapro she has had several friends tell her that this could help with her symptoms of depression and anxiety as well as perhaps improve her motivation and drive.  She has not seen any benefit taking Saxenda and losing weight.  She wants to discontinue this and she would like to switch back to the generic version of levothyroxine for cost reasons as she has not seen any significant benefits or switching to name brand Synthroid.  She denies any suicidal ideation.  The depression is better.  She is due for lab work.  She had a CMP and CBC in February that were normal.  She is due for TSH as her TSH was abnormal and she is also due for fasting lipid panel  Past Medical History:  Diagnosis Date  . Anxiety   . Depression   . Hypothyroidism    subclinical/borderline  . Smoker 02/23/2013  . Smoker    Past Surgical History:  Procedure Laterality Date  . CHOLECYSTECTOMY, LAPAROSCOPIC      Current Outpatient Medications on File Prior to Visit  Medication Sig Dispense Refill  . ALPRAZolam (XANAX) 0.5 MG tablet Take 1 tablet (0.5 mg total) by mouth 3 (three) times daily as needed for anxiety. 30 tablet 0  . aspirin 81 MG tablet Take 81 mg by mouth daily.    . fish oil-omega-3 fatty acids 1000 MG capsule Take 1 g by mouth daily.    Marland Kitchen. UNIFINE PENTIPS 31G X 5 MM MISC USE AS DIRECTED TO INJECT SAXENDA UNDER THE SKIN DAILY 100  each 1   No current facility-administered medications on file prior to visit.    Allergies  Allergen Reactions  . Penicillins    Social History   Socioeconomic History  . Marital status: Married    Spouse name: Not on file  . Number of children: Not on file  . Years of education: Not on file  . Highest education level: Not on file  Occupational History  . Not on file  Social Needs  . Financial resource strain: Not on file  . Food insecurity:    Worry: Not on file    Inability: Not on file  . Transportation needs:    Medical: Not on file    Non-medical: Not on file  Tobacco Use  . Smoking status: Former Smoker    Types: Cigarettes  . Smokeless tobacco: Former NeurosurgeonUser    Quit date: 03/25/2016  Substance and Sexual Activity  . Alcohol use: Yes    Alcohol/week: 0.0 standard drinks    Comment: Rare  . Drug use: No  . Sexual activity: Yes  Lifestyle  . Physical activity:    Days per week: Not on file    Minutes per session: Not on file  . Stress: Not on file  Relationships  . Social connections:  Talks on phone: Not on file    Gets together: Not on file    Attends religious service: Not on file    Active member of club or organization: Not on file    Attends meetings of clubs or organizations: Not on file    Relationship status: Not on file  . Intimate partner violence:    Fear of current or ex partner: Not on file    Emotionally abused: Not on file    Physically abused: Not on file    Forced sexual activity: Not on file  Other Topics Concern  . Not on file  Social History Narrative  . Not on file   Family History  Problem Relation Age of Onset  . Diabetes Mother   . Hyperlipidemia Mother   . Hypertension Mother   . Hypertension Father      Review of Systems     Objective:   Physical Exam  Constitutional: She is oriented to person, place, and time. She appears well-developed and well-nourished. No distress.  HENT:  Head: Normocephalic and  atraumatic.  Right Ear: External ear normal.  Left Ear: External ear normal.  Nose: Nose normal.  Mouth/Throat: Oropharynx is clear and moist. No oropharyngeal exudate.  Eyes: Pupils are equal, round, and reactive to light. Conjunctivae and EOM are normal. Right eye exhibits no discharge. Left eye exhibits no discharge. No scleral icterus.  Neck: Normal range of motion. Neck supple. No JVD present. No tracheal deviation present. No thyromegaly present.  Cardiovascular: Normal rate, regular rhythm, normal heart sounds and intact distal pulses. Exam reveals no gallop and no friction rub.  No murmur heard. Pulmonary/Chest: Effort normal and breath sounds normal. No stridor. No respiratory distress. She has no wheezes. She has no rales. She exhibits no tenderness.  Abdominal: Soft. Bowel sounds are normal. She exhibits no distension. There is no abdominal tenderness. There is no rebound and no guarding.  Musculoskeletal: Normal range of motion.        General: No tenderness, deformity or edema.  Lymphadenopathy:    She has no cervical adenopathy.  Neurological: She is alert and oriented to person, place, and time. She has normal reflexes. No cranial nerve deficit. She exhibits normal muscle tone. Coordination normal.  Skin: Skin is warm. She is not diaphoretic. No pallor.  Psychiatric: She has a normal mood and affect. Her behavior is normal. Judgment and thought content normal.          Assessment & Plan:  Other specified hypothyroidism - Plan: Lipid panel, TSH  History of tobacco use  General medical exam  Anxiety  I will recheck the patient's TSH as well as a fasting lipid panel..  Discontinue Zoloft and replace with Lexapro 10 mg a day.  Once she is tolerating the switch in medication, I would add Wellbutrin 150 mg p.o. every morning to augment her Lexapro and help better control her anxiety and depression.  Her last CBC and CMP were normal.  I will check a fasting lipid panel to  monitor her cholesterol.  Continue to encourage diet exercise and weight loss.  However I am very happy that the patient has seen benefit since starting the Lexapro and and/or Zoloft regarding her anxiety attacks.

## 2019-03-10 ENCOUNTER — Encounter: Payer: Self-pay | Admitting: Family Medicine

## 2019-03-10 ENCOUNTER — Other Ambulatory Visit: Payer: Self-pay | Admitting: Family Medicine

## 2019-03-10 MED ORDER — BUPROPION HCL ER (XL) 150 MG PO TB24: 150.0000 mg | ORAL_TABLET | Freq: Every day | ORAL | 1 refills | Status: DC

## 2019-03-10 MED FILL — buPROPion HCL ER (XL) 150 M: 150 | 90 days supply | Qty: 90 | Fill #0

## 2019-05-15 MED FILL — ESCITALOPRAM 10 MG TABLET: 10 | 90 days supply | Qty: 90 | Fill #1

## 2019-06-03 MED FILL — LEVOTHYROXINE 75 MCG TABLET: 75 | 90 days supply | Qty: 90 | Fill #1

## 2019-08-20 MED FILL — ESCITALOPRAM 10 MG TABLET: 10 | 90 days supply | Qty: 90 | Fill #2

## 2019-08-25 MED FILL — LEVOTHYROXINE 75 MCG TABLET: 75 | 90 days supply | Qty: 90 | Fill #2

## 2019-10-16 HISTORY — PX: BREAST EXCISIONAL BIOPSY: SUR124

## 2019-11-20 ENCOUNTER — Other Ambulatory Visit: Payer: Self-pay

## 2019-11-20 ENCOUNTER — Encounter: Payer: Self-pay | Admitting: Family Medicine

## 2019-11-20 ENCOUNTER — Ambulatory Visit (INDEPENDENT_AMBULATORY_CARE_PROVIDER_SITE_OTHER): Payer: No Typology Code available for payment source | Admitting: Family Medicine

## 2019-11-20 VITALS — BP 126/78 | HR 70 | Temp 97.8°F | Resp 14 | Ht 62.5 in | Wt 257.0 lb

## 2019-11-20 DIAGNOSIS — R1310 Dysphagia, unspecified: Secondary | ICD-10-CM | POA: Diagnosis not present

## 2019-11-20 DIAGNOSIS — R1319 Other dysphagia: Secondary | ICD-10-CM

## 2019-11-20 MED ORDER — PANTOPRAZOLE SODIUM 40 MG PO TBEC: 40.0000 mg | DELAYED_RELEASE_TABLET | Freq: Every day | ORAL | 3 refills | Status: DC

## 2019-11-20 MED FILL — PANTOPRAZOLE SOD DR 40 MG T: 40 | 30 days supply | Qty: 30 | Fill #0

## 2019-11-20 NOTE — Progress Notes (Signed)
Subjective:    Patient ID: Karen [name], female    DOB: 20-Dec-1975, 44 y.o.   MRN: [id number]  HPI  11/20/18 Patient states that over the last several months, her anxiety has worsened.  She went from having panic attacks once a week now to almost an every day phenomenon.  She states that she feels extremely anxious all the time.  She recently went on vacation and stopped taking levothyroxine by accident for 3 days and while she was off the medication, her anxiety attacks improved.  Therefore she has come concerned that her levothyroxine could be causing or contributing to her anxiety.  Therefore for the last month or so, the patient has been taking 44 mcg a day of levothyroxine.  Despite decreasing her dose by more than 50% she has not seen a substantial improvement in her frequency and severity of panic attacks.  However the patient is interested in switching to Synthroid or possibly Armour Thyroid as she believes that the thyroid medication is causing her anxiety attacks.  At that time, my plan was: I believe the patient has panic disorder coupled with generalized anxiety disorder.  I recommend starting an SSRI or other medication to help control and prevent panic attacks rather than switching levothyroxine.  I will check a TSH as a suspect that since she has decreased her dose to 44 mcg it will likely be elevated.  I will also check a free T3 and free T4.  Based on these lab results, we may need to increase her levothyroxine.  We can certainly switch the patient to the equivalent dose of Synthroid if this makes her more comfortable however my honest opinion is that the thyroid medication is not contributing to the panic attacks but this is rather a physical symptom of her underlying panic disorder  11/20/19 Over the last few months since Thanksgiving, patient has been developing gradually worsening dysphagia.  She states that food is getting stuck in her upper esophagus.  It is usually dry food such as  bread or mashed potatoes.  She will feel like it is hanging in her upper throat and will not pass.  She will often have to drink water to get it to go down.  When I asked her where she points roughly at the level of her thyroid.  She denies any melena or hematochezia.  She denies any hematemesis.  She denies any odynophagia.  Usually is not to meats or solids but rather thicker drier food the cause of the problem.  She has been noticing increasing acid reflux recently. Past Medical History:  Diagnosis Date  . Anxiety   . Depression   . Hypothyroidism    subclinical/borderline  . Smoker 02/23/2013  . Smoker    Past Surgical History:  Procedure Laterality Date  . CHOLECYSTECTOMY, LAPAROSCOPIC      Current Outpatient Medications on File Prior to Visit  Medication Sig Dispense Refill  . aspirin 81 MG tablet Take 81 mg by mouth daily.    Marland Kitchen escitalopram (LEXAPRO) 10 MG tablet Take 1 tablet (10 mg total) by mouth daily. 90 tablet 3  . fish oil-omega-3 fatty acids 1000 MG capsule Take 1 g by mouth daily.    Marland Kitchen levothyroxine (SYNTHROID) 75 MCG tablet Take 1 tablet (75 mcg total) by mouth daily before breakfast. 90 tablet 3   No current facility-administered medications on file prior to visit.   Allergies  Allergen Reactions  . Penicillins    Social History  Socioeconomic History  . Marital status: Married    Spouse name: Not on file  . Number of children: Not on file  . Years of education: Not on file  . Highest education level: Not on file  Occupational History  . Not on file  Tobacco Use  . Smoking status: Former Smoker    Types: Cigarettes  . Smokeless tobacco: Former Neurosurgeon    Quit date: 03/25/2016  Substance and Sexual Activity  . Alcohol use: Yes    Alcohol/week: 0.0 standard drinks    Comment: Rare  . Drug use: No  . Sexual activity: Yes  Other Topics Concern  . Not on file  Social History Narrative  . Not on file   Social Determinants of Health   Financial Resource  Strain:   . Difficulty of Paying Living Expenses: Not on file  Food Insecurity:   . Worried About Programme researcher, broadcasting/film/video in the Last Year: Not on file  . Ran Out of Food in the Last Year: Not on file  Transportation Needs:   . Lack of Transportation (Medical): Not on file  . Lack of Transportation (Non-Medical): Not on file  Physical Activity:   . Days of Exercise per Week: Not on file  . Minutes of Exercise per Session: Not on file  Stress:   . Feeling of Stress : Not on file  Social Connections:   . Frequency of Communication with Friends and Family: Not on file  . Frequency of Social Gatherings with Friends and Family: Not on file  . Attends Religious Services: Not on file  . Active Member of Clubs or Organizations: Not on file  . Attends Banker Meetings: Not on file  . Marital Status: Not on file  Intimate Partner Violence:   . Fear of Current or Ex-Partner: Not on file  . Emotionally Abused: Not on file  . Physically Abused: Not on file  . Sexually Abused: Not on file   Family History  Problem Relation Age of Onset  . Diabetes Mother   . Hyperlipidemia Mother   . Hypertension Mother   . Hypertension Father      Review of Systems  All other systems reviewed and are negative.      Objective:   Physical Exam  Constitutional: She appears well-developed and well-nourished. No distress.  HENT:  Mouth/Throat: Oropharynx is clear and moist. No oropharyngeal exudate.  Neck: No JVD present. No tracheal deviation present. No thyromegaly present.  Cardiovascular: Normal rate, regular rhythm, normal heart sounds and intact distal pulses. Exam reveals no gallop and no friction rub.  No murmur heard. Pulmonary/Chest: Effort normal and breath sounds normal. No respiratory distress. She has no wheezes. She has no rales. She exhibits no tenderness.  Musculoskeletal:     Cervical back: Neck supple.  Lymphadenopathy:    She has no cervical adenopathy.  Skin: She is  not diaphoretic.  Psychiatric: She has a normal mood and affect. Her behavior is normal. Judgment and thought content normal.          Assessment & Plan:    Esophageal dysphagia - Plan: Ambulatory referral to Gastroenterology  Differential diagnosis includes GERD causing esophageal swelling, eosinophilic esophagitis, esophageal stricture, or less likely a mass.  I will consult GI for an EGD for definitive diagnosis however I will empirically start the patient on Protonix 40 mg a day to cover possible GERD.  I recommended that she elevate the head of her bed 1 to 2 inches  to help with reflux.  Await GI consultation.

## 2019-11-25 ENCOUNTER — Encounter: Payer: Self-pay | Admitting: Internal Medicine

## 2019-12-04 MED FILL — ESCITALOPRAM 10 MG TABLET: 10 | 90 days supply | Qty: 90 | Fill #3

## 2019-12-04 MED FILL — LEVOTHYROXINE 75 MCG TABLET: 75 | 90 days supply | Qty: 90 | Fill #3

## 2019-12-17 ENCOUNTER — Ambulatory Visit: Payer: Self-pay | Admitting: Gastroenterology

## 2019-12-19 NOTE — Progress Notes (Signed)
Referring Provider: Susy Frizzle, MD Primary Care Physician:  Susy Frizzle, MD Primary Gastroenterologist:  Dr. Gala Romney  Chief Complaint  Patient presents with  . Dysphagia    Trouble when swallowing foods    HPI:   Karen [name] is a 44 y.o. female presenting today at the request of Susy Frizzle, MD for dysphagia.  Reviewed PCP note dated 11/20/2019.  Patient reported gradual development of dysphagia symptoms since February 2020.  Felt dry foods such as bread and mashed potatoes getting stuck in her upper esophagus requiring water to push foods down.  Also with increasing acid reflux.  She was empirically started on Protonix 40 mg daily and referred to GI.  Today:  Dysphagia symptoms started around September 2020. Has been worsening. Increasing in frequency and a couple of times has felt she was choking. Feels foods get hung around the sternal notch. Improved with protonix. Has occurred twice within the last month. Was every other day prior to this. Occurs with solid foods, potatoes, and dry foods. No trouble with soft foods, liquids, or pills. Has trouble with acid reflux. Gluten free, dairy free, avoids citrus, spicy foods, caffeine, peppermint, caffeine, and teas. Taking Protonix daily. Breakthrough symptoms maybe once a week. Typically at night. Will take tums as needed. This helps. Doesn't eat within 3 hours of laying down. Head of bed not propped up. Limits fried/greasy foods.   GERD has been present for several years. Has only ever used tums and now Protonix.   No abdominal pain. No blood in the stool or black stools. No nausea or vomiting. No constipation or diarrhea.   No NSAIDs other than baby aspirin.   Past Medical History:  Diagnosis Date  . Anxiety   . Depression   . GERD (gastroesophageal reflux disease)   . Hypothyroidism    subclinical/borderline  . Smoker 02/23/2013  . Smoker     Past Surgical History:  Procedure Laterality Date  .  CHOLECYSTECTOMY, LAPAROSCOPIC      Current Outpatient Medications  Medication Sig Dispense Refill  . aspirin 81 MG tablet Take 81 mg by mouth daily.    Marland Kitchen escitalopram (LEXAPRO) 10 MG tablet Take 1 tablet (10 mg total) by mouth daily. 90 tablet 3  . fish oil-omega-3 fatty acids 1000 MG capsule Take 1 g by mouth daily.    Marland Kitchen levothyroxine (SYNTHROID) 75 MCG tablet Take 1 tablet (75 mcg total) by mouth daily before breakfast. 90 tablet 3  . pantoprazole (PROTONIX) 40 MG tablet Take 1 tablet (40 mg total) by mouth daily before breakfast. 30 tablet 5   No current facility-administered medications for this visit.    Allergies as of 12/21/2019 - Review Complete 12/21/2019  Allergen Reaction Noted  . Penicillins  02/23/2013    Family History  Problem Relation Age of Onset  . Diabetes Mother   . Hyperlipidemia Mother   . Hypertension Mother   . Hypertension Father   . Colon cancer Neg Hx     Social History   Socioeconomic History  . Marital status: Married    Spouse name: Not on file  . Number of children: Not on file  . Years of education: Not on file  . Highest education level: Not on file  Occupational History  . Not on file  Tobacco Use  . Smoking status: Former Smoker    Types: Cigarettes  . Smokeless tobacco: Former Systems developer    Quit date: 03/25/2016  Substance and Sexual Activity  .  Alcohol use: Not Currently    Alcohol/week: 0.0 standard drinks    Comment: Rare-maybe once a year  . Drug use: No  . Sexual activity: Yes  Other Topics Concern  . Not on file  Social History Narrative  . Not on file   Social Determinants of Health   Financial Resource Strain:   . Difficulty of Paying Living Expenses: Not on file  Food Insecurity:   . Worried About Programme researcher, broadcasting/film/video in the Last Year: Not on file  . Ran Out of Food in the Last Year: Not on file  Transportation Needs:   . Lack of Transportation (Medical): Not on file  . Lack of Transportation (Non-Medical): Not on  file  Physical Activity:   . Days of Exercise per Week: Not on file  . Minutes of Exercise per Session: Not on file  Stress:   . Feeling of Stress : Not on file  Social Connections:   . Frequency of Communication with Friends and Family: Not on file  . Frequency of Social Gatherings with Friends and Family: Not on file  . Attends Religious Services: Not on file  . Active Member of Clubs or Organizations: Not on file  . Attends Banker Meetings: Not on file  . Marital Status: Not on file  Intimate Partner Violence:   . Fear of Current or Ex-Partner: Not on file  . Emotionally Abused: Not on file  . Physically Abused: Not on file  . Sexually Abused: Not on file    Review of Systems: Gen: Denies any fever, chills, lightheadedness, dizziness, presyncope, syncope.  Denies headache or blurry vision. CV: Denies chest pain or heart palpitations.    Resp: Denies shortness of breath or cough. GI: See HPI GU : Denies urinary burning, urinary frequency, urinary hesitancy MS: Denies joint pain Derm: Denies rash Psych: History of panic attacks. Lexapro working well. Has a little anxiety at this time.  Heme: See HPI  Physical Exam: BP (!) 155/97   Pulse 76   Temp (!) 97.3 F (36.3 C) (Temporal)   Ht 5\' 3"  (1.6 m)   Wt 260 lb 12.8 oz (118.3 kg)   LMP 11/16/2019   BMI 46.20 kg/m  General:   Alert and oriented. Pleasant and cooperative. Well-nourished and well-developed.  Head:  Normocephalic and atraumatic. Eyes:  Without icterus, sclera clear and conjunctiva pink.  Ears:  Normal auditory acuity. Lungs:  Clear to auscultation bilaterally. No wheezes, rales, or rhonchi. No distress.  Heart:  S1, S2 present without murmurs appreciated.  Abdomen:  +BS, soft, non-tender and non-distended. No HSM noted. No guarding or rebound. No masses appreciated.  Rectal:  Deferred  Msk:  Symmetrical without gross deformities. Normal posture. Extremities:  Without edema. Neurologic:   Alert and  oriented x4;  grossly normal neurologically. Skin:  Intact without significant lesions or rashes. Psych: Normal mood and affect.

## 2019-12-21 ENCOUNTER — Encounter: Payer: Self-pay | Admitting: Gastroenterology

## 2019-12-21 ENCOUNTER — Ambulatory Visit: Payer: No Typology Code available for payment source | Admitting: Gastroenterology

## 2019-12-21 ENCOUNTER — Telehealth: Payer: Self-pay | Admitting: *Deleted

## 2019-12-21 ENCOUNTER — Other Ambulatory Visit: Payer: Self-pay

## 2019-12-21 VITALS — BP 155/97 | HR 76 | Temp 97.3°F | Ht 63.0 in | Wt 260.8 lb

## 2019-12-21 DIAGNOSIS — R1310 Dysphagia, unspecified: Secondary | ICD-10-CM | POA: Diagnosis not present

## 2019-12-21 DIAGNOSIS — K219 Gastro-esophageal reflux disease without esophagitis: Secondary | ICD-10-CM | POA: Diagnosis not present

## 2019-12-21 DIAGNOSIS — R030 Elevated blood-pressure reading, without diagnosis of hypertension: Secondary | ICD-10-CM | POA: Diagnosis not present

## 2019-12-21 MED ORDER — PANTOPRAZOLE SODIUM 40 MG PO TBEC: 40.0000 mg | DELAYED_RELEASE_TABLET | Freq: Every day | ORAL | 5 refills | Status: DC

## 2019-12-21 MED FILL — PANTOPRAZOLE SOD DR 40 MG T: 40 | 90 days supply | Qty: 90 | Fill #0

## 2019-12-21 NOTE — Assessment & Plan Note (Signed)
Blood pressure is elevated today at 155/97.  No headache, blurry vision, chest pain, heart palpitations, or shortness of breath.  Patient reports her blood pressure is not usually elevated.  Recently saw PCP on 11/20/2019 and blood pressure at that time was 126/78.  Recommended she continue to monitor her blood pressure and follow-up with PCP should she continue to have blood pressure readings greater than 130/80.

## 2019-12-21 NOTE — Assessment & Plan Note (Addendum)
Chronic GERD.  Historically took Tums daily until recently started on Protonix 40 mg daily in February 2021 by PCP.  GERD symptoms are fairly well controlled at this time.  Occasional breakthrough symptoms about once a week, typically at night.  Will take Tums as needed which helps.  She did have new onset of dysphagia around September 2020 as discussed above.  Currently, dysphagia symptoms are improving with Protonix.  No other significant upper or lower GI symptoms.  No other alarm symptoms.  Continue Protonix 40 mg daily 30 minutes before breakfast.  Prescription refilled today.  Advised not to take within 4 hours of Synthroid. Continue Tums as needed. Counseled on GERD diet and lifestyle.  Handout provided. Proceed with EGD +/- dilation with propofol with Dr. Jena Gauss in the near future. The risks, benefits, and alternatives have been discussed in detail with patient. They have stated understanding and desire to proceed.  Follow-up after procedure.  Advise she call if she had questions or concerns prior.

## 2019-12-21 NOTE — Telephone Encounter (Signed)
LMTCB with spouse to schedule TCS +/-egd with propofol with RMR

## 2019-12-21 NOTE — Patient Instructions (Addendum)
We will get you scheduled for an upper endoscopy with possible dilation of your esophagus in the near future with Dr. Benard Rink.  Continue taking Protonix 40 mg daily 30 minutes before breakfast.  Be sure you are not taking Protonix within 4 hours of Synthroid.  I have sent a refill of Protonix to your pharmacy.  You may continue to use Tums as needed for breakthrough reflux symptoms.  Continue avoiding GERD triggers including fried, fatty, greasy, spicy, citrus foods, carbonated beverages, caffeine, and chocolate.  Do not eat within 3 hours of laying down.  Prop the head of your bed up on wood or bricks to create a 6 inch incline.  Continue to monitor your blood pressure.  Should you continue to have elevated readings above 130/80, please discuss further with your PCP.  We will plan to see you back after your procedure.  Call if you have questions or concerns prior.  Ermalinda Memos, PA-C [facility]     Food Choices for Gastroesophageal Reflux Disease, Adult When you have gastroesophageal reflux disease (GERD), the foods you eat and your eating habits are very important. Choosing the right foods can help ease your discomfort. Think about working with a nutrition specialist (dietitian) to help you make good choices. What are tips for following this plan?  Meals  Choose healthy foods that are low in fat, such as fruits, vegetables, whole grains, low-fat dairy products, and lean meat, fish, and poultry.  Eat small meals often instead of 3 large meals a day. Eat your meals slowly, and in a place where you are relaxed. Avoid bending over or lying down until 2-3 hours after eating.  Avoid eating meals 2-3 hours before bed.  Avoid drinking a lot of liquid with meals.  Cook foods using methods other than frying. Bake, grill, or broil food instead.  Avoid or limit: ? Chocolate. ? Peppermint or spearmint. ? Alcohol. ? Pepper. ? Black and decaffeinated coffee. ? Black and  decaffeinated tea. ? Bubbly (carbonated) soft drinks. ? Caffeinated energy drinks and soft drinks.  Limit high-fat foods such as: ? Fatty meat or fried foods. ? Whole milk, cream, butter, or ice cream. ? Nuts and nut butters. ? Pastries, donuts, and sweets made with butter or shortening.  Avoid foods that cause symptoms. These foods may be different for everyone. Common foods that cause symptoms include: ? Tomatoes. ? Oranges, lemons, and limes. ? Peppers. ? Spicy food. ? Onions and garlic. ? Vinegar. Lifestyle  Maintain a healthy weight. Ask your doctor what weight is healthy for you. If you need to lose weight, work with your doctor to do so safely.  Exercise for at least 30 minutes for 5 or more days each week, or as told by your doctor.  Wear loose-fitting clothes.  Do not smoke. If you need help quitting, ask your doctor.  Sleep with the head of your bed higher than your feet. Use a wedge under the mattress or blocks under the bed frame to raise the head of the bed. Summary  When you have gastroesophageal reflux disease (GERD), food and lifestyle choices are very important in easing your symptoms.  Eat small meals often instead of 3 large meals a day. Eat your meals slowly, and in a place where you are relaxed.  Limit high-fat foods such as fatty meat or fried foods.  Avoid bending over or lying down until 2-3 hours after eating.  Avoid peppermint and spearmint, caffeine, alcohol, and chocolate. This information is not  intended to replace advice given to you by your health care provider. Make sure you discuss any questions you have with your health care provider. Document Revised: 01/22/2019 Document Reviewed: 11/06/2016 Elsevier Patient Education  [place].

## 2019-12-21 NOTE — Assessment & Plan Note (Addendum)
44 year old female with history of chronic GERD and new onset of solid food dysphagia around September 2020.  Feels foods getting hung around the sternal notch.  PCP recently started her on Protonix in February 2021 which has improved dysphagia symptoms.  Has only occurred twice in the last month whereas symptoms were occurring every other day.  Reflux symptoms fairly well controlled with breakthrough maybe once a week, typically at night.  Working hard to avoid dietary triggers.  No other significant upper or lower GI symptoms.  Suspect she may have esophageal web, ring, or stricture secondary to chronic GERD.  May also have eosinophilic esophagitis.  She needs EGD for further evaluation/therapeutic intervention.  Proceed with EGD +/- dilation with propofol with Dr. Jena Gauss in the near future. The risks, benefits, and alternatives have been discussed in detail with patient. They have stated understanding and desire to proceed.  Propofol due to BMI 46.2.  Also reports history of panic attacks on Lexapro. Continue Protonix 40 mg daily 30 minutes before breakfast.  Prescription refilled today. Counseled on GERD diet and lifestyle.  Handout provided. Plan to follow-up after procedure.  Advise she call if she has questions or concerns prior.

## 2019-12-22 ENCOUNTER — Telehealth: Payer: Self-pay | Admitting: Internal Medicine

## 2019-12-22 NOTE — Telephone Encounter (Signed)
See PN already opened

## 2019-12-22 NOTE — Telephone Encounter (Signed)
Called [facility]. PA is required for procedure. PA done over the phone. Has been approved. Auth# [id number] valid until 03/26/2020

## 2019-12-22 NOTE — Telephone Encounter (Signed)
Pt was returning call from yesterday. [phone]

## 2019-12-22 NOTE — Telephone Encounter (Signed)
CALLED PT. She is scheduled for procedure 5/13 at 8:00am. Aware will send instructions and covid/preop appt to [facility].

## 2020-02-23 ENCOUNTER — Other Ambulatory Visit (HOSPITAL_COMMUNITY)
Admission: RE | Admit: 2020-02-23 | Discharge: 2020-02-23 | Disposition: A | Discharge status: Home / Self Care | Payer: No Typology Code available for payment source | Source: Ambulatory Visit | Attending: Internal Medicine | Admitting: Internal Medicine

## 2020-02-23 ENCOUNTER — Telehealth: Payer: Self-pay | Admitting: *Deleted

## 2020-02-23 ENCOUNTER — Encounter (HOSPITAL_COMMUNITY): Admission: RE | Admit: 2020-02-23 | Payer: No Typology Code available for payment source | Source: Ambulatory Visit

## 2020-02-23 NOTE — Telephone Encounter (Signed)
Noted  

## 2020-02-23 NOTE — Telephone Encounter (Signed)
Patient called in to cancel procedure scheduled for Thursday. States she will call back to r/s. Not having done right now. Called endo and LMOVM to cancel. FYI to [facility]

## 2020-02-25 ENCOUNTER — Encounter (HOSPITAL_COMMUNITY): Admission: RE | Payer: Self-pay | Source: Home / Self Care

## 2020-02-25 ENCOUNTER — Ambulatory Visit (HOSPITAL_COMMUNITY)
Admission: RE | Admit: 2020-02-25 | Payer: No Typology Code available for payment source | Source: Home / Self Care | Admitting: Internal Medicine

## 2020-02-25 SURGERY — ESOPHAGOGASTRODUODENOSCOPY (EGD) WITH PROPOFOL
Anesthesia: Monitor Anesthesia Care

## 2020-03-03 ENCOUNTER — Ambulatory Visit: Payer: No Typology Code available for payment source | Admitting: Family Medicine

## 2020-03-10 ENCOUNTER — Other Ambulatory Visit: Payer: Self-pay

## 2020-03-10 ENCOUNTER — Ambulatory Visit (INDEPENDENT_AMBULATORY_CARE_PROVIDER_SITE_OTHER): Payer: No Typology Code available for payment source | Admitting: Family Medicine

## 2020-03-10 VITALS — BP 140/80 | HR 74 | Temp 97.4°F | Resp 16 | Ht 63.0 in | Wt 265.0 lb

## 2020-03-10 DIAGNOSIS — Z0001 Encounter for general adult medical examination with abnormal findings: Secondary | ICD-10-CM

## 2020-03-10 DIAGNOSIS — E038 Other specified hypothyroidism: Secondary | ICD-10-CM | POA: Diagnosis not present

## 2020-03-10 DIAGNOSIS — F419 Anxiety disorder, unspecified: Secondary | ICD-10-CM

## 2020-03-10 DIAGNOSIS — Z0000 Encounter for general adult medical examination without abnormal findings: Secondary | ICD-10-CM

## 2020-03-10 DIAGNOSIS — Z1231 Encounter for screening mammogram for malignant neoplasm of breast: Secondary | ICD-10-CM

## 2020-03-10 LAB — COMPLETE METABOLIC PANEL WITH GFR
AG Ratio: 1.6 (calc) (ref 1.0–2.5)
ALT: 18 U/L (ref 6–29)
AST: 15 U/L (ref 10–30)
Albumin: 3.9 g/dL (ref 3.6–5.1)
Alkaline phosphatase (APISO): 65 U/L (ref 31–125)
BUN: 9 mg/dL (ref 7–25)
CO2: 23 mmol/L (ref 20–32)
Calcium: 9 mg/dL (ref 8.6–10.2)
Chloride: 107 mmol/L (ref 98–110)
Creat: 0.7 mg/dL (ref 0.50–1.10)
GFR, Est African American: 122 mL/min/{1.73_m2} (ref >=60)
GFR, Est Non African American: 105 mL/min/{1.73_m2} (ref >=60)
Globulin: 2.5 g/dL (calc) (ref 1.9–3.7)
Glucose, Bld: 113 mg/dL — ABNORMAL HIGH (ref 65–99)
Potassium: 3.9 mmol/L (ref 3.5–5.3)
Sodium: 137 mmol/L (ref 135–146)
Total Bilirubin: 0.3 mg/dL (ref 0.2–1.2)
Total Protein: 6.4 g/dL (ref 6.1–8.1)

## 2020-03-10 LAB — CBC WITH DIFFERENTIAL/PLATELET
Absolute Monocytes: 485 cells/uL (ref 200–950)
Basophils Absolute: 50 cells/uL (ref 0–200)
Basophils Relative: 0.8 %
Eosinophils Absolute: 239 cells/uL (ref 15–500)
Eosinophils Relative: 3.8 %
HCT: 42.5 % (ref 35.0–45.0)
Hemoglobin: 14.3 g/dL (ref 11.7–15.5)
Lymphs Abs: 2885 cells/uL (ref 850–3900)
MCH: 29.2 pg (ref 27.0–33.0)
MCHC: 33.6 g/dL (ref 32.0–36.0)
MCV: 86.9 fL (ref 80.0–100.0)
MPV: 10.1 fL (ref 7.5–12.5)
Monocytes Relative: 7.7 %
Neutro Abs: 2640 cells/uL (ref 1500–7800)
Neutrophils Relative %: 41.9 %
Platelets: 310 10*3/uL (ref 140–400)
RBC: 4.89 10*6/uL (ref 3.80–5.10)
RDW: 13.7 % (ref 11.0–15.0)
Total Lymphocyte: 45.8 %
WBC: 6.3 10*3/uL (ref 3.8–10.8)

## 2020-03-10 LAB — TSH: TSH: 4.95 mIU/L — ABNORMAL HIGH

## 2020-03-10 LAB — LIPID PANEL
Cholesterol: 212 mg/dL — ABNORMAL HIGH (ref <200)
HDL: 56 mg/dL (ref >=50)
LDL Cholesterol (Calc): 131 mg/dL (calc) — ABNORMAL HIGH
Non-HDL Cholesterol (Calc): 156 mg/dL (calc) — ABNORMAL HIGH (ref <130)
Total CHOL/HDL Ratio: 3.8 (calc) (ref <5.0)
Triglycerides: 139 mg/dL (ref <150)

## 2020-03-10 MED ORDER — PHENTERMINE HCL 37.5 MG PO TABS: 37.5000 mg | ORAL_TABLET | Freq: Every day | ORAL | 2 refills | Status: DC

## 2020-03-10 NOTE — Progress Notes (Signed)
Subjective:    Patient ID: Karen [name], female    DOB: 02/05/1976, 44 y.o.   MRN: [id number]  Medication Refill  Gynecologic Exam   Patient is here today for physical exam.  She denies any medical concerns other than she is having a difficult time with weight loss.  BMI is almost 47.  She has tried [place] in the past but saw no benefit from this.  She is not regularly exercising.  She also reports fatigue.  Otherwise she is doing well with no concerns.  She is due for Pap smear.  She is also due for mammogram.  She is due for fasting lab work to monitor her cholesterol as well as her thyroid.   Past Medical History:  Diagnosis Date  . Anxiety   . Depression   . GERD (gastroesophageal reflux disease)   . Hypothyroidism    subclinical/borderline  . Smoker 02/23/2013  . Smoker   . Thyroid disease    Phreesia 03/07/2020   Past Surgical History:  Procedure Laterality Date  . CHOLECYSTECTOMY N/A    Phreesia 03/07/2020  . CHOLECYSTECTOMY, LAPAROSCOPIC      Current Outpatient Medications on File Prior to Visit  Medication Sig Dispense Refill  . aspirin EC 81 MG tablet Take 81 mg by mouth daily.    Marland Kitchen escitalopram (LEXAPRO) 10 MG tablet Take 1 tablet (10 mg total) by mouth daily. 90 tablet 3  . fish oil-omega-3 fatty acids 1000 MG capsule Take 1,000 mg by mouth daily.     Marland Kitchen levothyroxine (SYNTHROID) 75 MCG tablet Take 1 tablet (75 mcg total) by mouth daily before breakfast. 90 tablet 3  . pantoprazole (PROTONIX) 40 MG tablet Take 1 tablet (40 mg total) by mouth daily before breakfast. (Patient taking differently: Take 40 mg by mouth every evening. ) 30 tablet 5   No current facility-administered medications on file prior to visit.   Allergies  Allergen Reactions  . Penicillins Hives   Social History   Socioeconomic History  . Marital status: Married    Spouse name: Not on file  . Number of children: Not on file  . Years of education: Not on file  . Highest education  level: Not on file  Occupational History  . Not on file  Tobacco Use  . Smoking status: Former Smoker    Types: Cigarettes  . Smokeless tobacco: Former Systems developer    Quit date: 03/25/2016  Substance and Sexual Activity  . Alcohol use: Not Currently    Alcohol/week: 0.0 standard drinks    Comment: Rare-maybe once a year  . Drug use: No  . Sexual activity: Yes  Other Topics Concern  . Not on file  Social History Narrative  . Not on file   Social Determinants of Health   Financial Resource Strain:   . Difficulty of Paying Living Expenses:   Food Insecurity:   . Worried About Charity fundraiser in the Last Year:   . Arboriculturist in the Last Year:   Transportation Needs:   . Film/video editor (Medical):   Marland Kitchen Lack of Transportation (Non-Medical):   Physical Activity:   . Days of Exercise per Week:   . Minutes of Exercise per Session:   Stress:   . Feeling of Stress :   Social Connections:   . Frequency of Communication with Friends and Family:   . Frequency of Social Gatherings with Friends and Family:   . Attends Religious Services:   .  Active Member of Clubs or Organizations:   . Attends Banker Meetings:   Marland Kitchen Marital Status:   Intimate Partner Violence:   . Fear of Current or Ex-Partner:   . Emotionally Abused:   Marland Kitchen Physically Abused:   . Sexually Abused:    Family History  Problem Relation Age of Onset  . Diabetes Mother   . Hyperlipidemia Mother   . Hypertension Mother   . Hypertension Father   . Colon cancer Neg Hx      Review of Systems     Objective:   Physical Exam  Constitutional: She is oriented to person, place, and time. She appears well-developed and well-nourished. No distress.  HENT:  Head: Normocephalic and atraumatic.  Right Ear: External ear normal.  Left Ear: External ear normal.  Nose: Nose normal.  Mouth/Throat: Oropharynx is clear and moist. No oropharyngeal exudate.  Eyes: Pupils are equal, round, and reactive to  light. Conjunctivae and EOM are normal. Right eye exhibits no discharge. Left eye exhibits no discharge. No scleral icterus.  Neck: No JVD present. No tracheal deviation present. No thyromegaly present.  Cardiovascular: Normal rate, regular rhythm, normal heart sounds and intact distal pulses. Exam reveals no gallop and no friction rub.  No murmur heard. Pulmonary/Chest: Effort normal and breath sounds normal. No stridor. No respiratory distress. She has no wheezes. She has no rales. She exhibits no tenderness.  Abdominal: Soft. Bowel sounds are normal. She exhibits no distension. There is no abdominal tenderness. There is no rebound and no guarding.  Genitourinary:    Vagina and uterus normal.  Cervix exhibits no motion tenderness. Right adnexum displays no mass and no tenderness. Left adnexum displays no mass and no tenderness.  Musculoskeletal:        General: No tenderness, deformity or edema. Normal range of motion.     Cervical back: Normal range of motion and neck supple.  Lymphadenopathy:    She has no cervical adenopathy.  Neurological: She is alert and oriented to person, place, and time. She has normal reflexes. No cranial nerve deficit. She exhibits normal muscle tone. Coordination normal.  Skin: Skin is warm. She is not diaphoretic. No pallor.  Psychiatric: She has a normal mood and affect. Her behavior is normal. Judgment and thought content normal.          Assessment & Plan:  General medical exam - Plan: CBC with Differential/Platelet, COMPLETE METABOLIC PANEL WITH GFR, Lipid panel, TSH, PAP, Thin Prep w/HPV rflx HPV Type 16/18  Morbid obesity (HCC)  Other specified hypothyroidism - Plan: TSH  Anxiety  I will schedule patient for mammogram to evaluate for breast cancer.  Pap smear was performed today and sent to pathology in a labeled container.  Immunizations are up-to-date.  Check CBC, CMP, fasting lipid panel.  Check a TSH to monitor for the management of her  hypothyroidism to ensure adequate dosage of levothyroxine.  Regarding her weight loss attempts, I recommended diet exercise and calorie restriction.  However I will give her phentermine 37.5 mg p.o. every morning for the next 90 days to help suppress her appetite, hopefully improve her energy, can help jumpstart the therapeutic lifestyle changes that are ultimately necessary to maintain weight loss successfully.

## 2020-03-11 LAB — PAP, TP IMAGING W/ HPV RNA, RFLX HPV TYPE 16,18/45: HPV DNA High Risk: NOT DETECTED

## 2020-03-14 ENCOUNTER — Encounter: Payer: Self-pay | Admitting: Family Medicine

## 2020-03-15 ENCOUNTER — Other Ambulatory Visit: Payer: Self-pay | Admitting: Family Medicine

## 2020-03-15 MED ORDER — LEVOTHYROXINE SODIUM 88 MCG PO TABS: 88.0000 ug | ORAL_TABLET | Freq: Every day | ORAL | 3 refills | Status: DC

## 2020-03-15 MED FILL — LEVOTHYROXINE 88 MCG TABLET: 88 | 90 days supply | Qty: 90 | Fill #0

## 2020-04-25 ENCOUNTER — Other Ambulatory Visit: Payer: Self-pay | Admitting: Family Medicine

## 2020-04-28 ENCOUNTER — Other Ambulatory Visit: Payer: Self-pay | Admitting: Family Medicine

## 2020-04-28 ENCOUNTER — Telehealth: Payer: Self-pay | Admitting: Family Medicine

## 2020-04-28 MED FILL — ESCITALOPRAM 10 MG TABLET: 10 | 90 days supply | Qty: 90 | Fill #0

## 2020-04-28 NOTE — Telephone Encounter (Signed)
CB# [phone] Pt was denied refill on Lexpro wasn't sure why ? When she was seen by  Dr.Pickarkd on 03-10-20

## 2020-04-29 ENCOUNTER — Other Ambulatory Visit: Payer: Self-pay

## 2020-04-29 MED ORDER — ESCITALOPRAM OXALATE 10 MG PO TABS: 10.0000 mg | ORAL_TABLET | Freq: Every day | ORAL | 1 refills | Status: DC

## 2020-04-29 NOTE — Telephone Encounter (Signed)
Pt rx refill was sent again, it was more refills than it should have been

## 2020-05-02 ENCOUNTER — Other Ambulatory Visit: Payer: Self-pay

## 2020-05-02 ENCOUNTER — Ambulatory Visit
Admission: RE | Admit: 2020-05-02 | Discharge: 2020-05-02 | Disposition: A | Discharge status: Home / Self Care | Payer: No Typology Code available for payment source | Source: Ambulatory Visit | Attending: Family Medicine | Admitting: Family Medicine

## 2020-05-02 DIAGNOSIS — Z0000 Encounter for general adult medical examination without abnormal findings: Secondary | ICD-10-CM

## 2020-05-02 DIAGNOSIS — Z1231 Encounter for screening mammogram for malignant neoplasm of breast: Secondary | ICD-10-CM

## 2020-05-04 ENCOUNTER — Other Ambulatory Visit: Payer: Self-pay | Admitting: Family Medicine

## 2020-05-04 DIAGNOSIS — R928 Other abnormal and inconclusive findings on diagnostic imaging of breast: Secondary | ICD-10-CM

## 2020-05-09 MED FILL — PHENTERMINE 37.5 MG TABLET: 37.5 | 30 days supply | Qty: 30 | Fill #0

## 2020-05-17 ENCOUNTER — Ambulatory Visit
Admission: RE | Admit: 2020-05-17 | Discharge: 2020-05-17 | Disposition: A | Discharge status: Home / Self Care | Payer: No Typology Code available for payment source | Source: Ambulatory Visit | Attending: Family Medicine | Admitting: Family Medicine

## 2020-05-17 ENCOUNTER — Other Ambulatory Visit: Payer: Self-pay | Admitting: Family Medicine

## 2020-05-17 ENCOUNTER — Other Ambulatory Visit: Payer: Self-pay

## 2020-05-17 DIAGNOSIS — R928 Other abnormal and inconclusive findings on diagnostic imaging of breast: Secondary | ICD-10-CM

## 2020-05-19 ENCOUNTER — Encounter: Payer: Self-pay | Admitting: Family Medicine

## 2020-05-20 ENCOUNTER — Ambulatory Visit: Payer: No Typology Code available for payment source | Admitting: Family Medicine

## 2020-05-26 ENCOUNTER — Ambulatory Visit
Admission: RE | Admit: 2020-05-26 | Discharge: 2020-05-26 | Disposition: A | Discharge status: Home / Self Care | Payer: No Typology Code available for payment source | Source: Ambulatory Visit | Attending: Family Medicine | Admitting: Family Medicine

## 2020-05-26 ENCOUNTER — Other Ambulatory Visit: Payer: Self-pay

## 2020-05-26 DIAGNOSIS — R928 Other abnormal and inconclusive findings on diagnostic imaging of breast: Secondary | ICD-10-CM

## 2020-06-21 ENCOUNTER — Ambulatory Visit: Payer: Self-pay | Admitting: Surgery

## 2020-06-21 DIAGNOSIS — N6489 Other specified disorders of breast: Secondary | ICD-10-CM

## 2020-06-21 MED FILL — LEVOTHYROXINE 88 MCG TABLET: 88 | 90 days supply | Qty: 90 | Fill #1

## 2020-06-21 NOTE — H&P (Signed)
Karen [name] Appointment: 06/21/2020 9:40 AM Location: [facility] Patient #: [id number] DOB: [birth year] Married / Language: [place] / Race: White [name]  History of Present Illness Karen [name] A. Yentl Verge MD; 06/21/2020 10:32 AM) Patient words: 44 year old [name] recalled from screening mammogram dated 05/02/2020 for a possible right breast asymmetry. Patient denies any history of breast pain, nipple discharge or previous breast biopsies. There is no family history of breast cancer. Imaging revealed an area in the right breast upper outer quadrant roughly 8 cm in maximal diameter core biopsy shown to be a radial scar with atypical lobular hyperplasia.  EXAM: DIGITAL DIAGNOSTIC RIGHT MAMMOGRAM WITH CAD AND TOMO  ULTRASOUND RIGHT BREAST  COMPARISON: Previous exam(s).  ACR Breast Density Category b: There are scattered areas of fibroglandular density.  FINDINGS: Persistent linear, asymmetry with questioned associated distortion is demonstrated in the lateral right breast at mid depth. A few circumscribed masses are noted within the asymmetry. Further evaluation with ultrasound was performed.  Mammographic images were processed with CAD.  Targeted ultrasound is performed, showing multiple hypoechoic masses in a linear orientation with the 8 o'clock position 5 cm from the nipple on the right. The largest mass demonstrates an internal calcification and measures 8 x 6 x 4 cm. The appearance is suggestive of a dilated duct, or stacked duct appearance.  Evaluation of the right axilla demonstrates no suspicious lymphadenopathy.  IMPRESSION: 1. Multiple indeterminate masses in a linear orientation in the lateral right breast. The findings may suggest a stacked duct appearance. Recommendation is for ultrasound-guided biopsy. 2. No suspicious right axillary lymphadenopathy.  RECOMMENDATION: Ultrasound-guided biopsy of the right breast.  I have discussed the findings and  recommendations with the patient. If applicable, a reminder letter will be sent to the patient regarding the next appointment.  BI-RADS CATEGORY 4: Suspicious.   Electronically Signed By: Sande Brothers M.D. On: 05/17/2020 16:28           Diagnosis Breast, right, needle core biopsy, 8 o'clock - COMPLEX SCLEROSING LESION. - FOCAL ATYPICAL LOBULAR HYPERPLASIA. - FIBROCYSTIC CHANGES WITH USUAL DUCTAL HYPERPLASIA.  The patient is a 44 year old [name].   Past Surgical History Karen [name], CMA; 06/21/2020 9:55 AM) Gallbladder Surgery - Laparoscopic  Diagnostic Studies History Karen [name], CMA; 06/21/2020 9:55 AM) Colonoscopy never Mammogram within last year Pap Smear 1-5 years ago  Allergies Karen [name], CMA; 06/21/2020 9:55 AM) Penicillin G Potassium *PENICILLINS*  Medication History (Karen [name], CMA; 06/21/2020 9:56 AM) Levothyroxine Sodium ( Tablet, Oral) Active. Pantoprazole Sodium (40MG  Tablet DR, Oral) Active. Escitalopram Oxalate (10MG  Tablet, Oral) Active. Aspirin (81MG  Tablet, Oral) Active. Omega 3 (Oral) Specific strength unknown - Active. Medications Reconciled  Social History Karen [name], CMA; 06/21/2020 9:55 AM) Alcohol use Occasional alcohol use. Caffeine use Coffee. No drug use Tobacco use Former smoker.  Family History R. Karen Duster, CMA; 06/21/2020 9:55 AM) Diabetes Mellitus Mother. Hypertension Father, Mother.  Pregnancy / Birth History Karen Duster R. Karen Baton, CMA; 06/21/2020 9:55 AM) Age at menarche 13 years. Gravida 4 Length (months) of breastfeeding 3-6 Maternal age 75-20 Para 3 Regular periods  Other Problems Karen [name], CMA; 06/21/2020 9:55 AM) Anxiety Disorder Thyroid Disease     Review of Systems Karen [name] Karen [name] CMA; 06/21/2020 9:55 AM) General Not Present- Appetite Loss, Chills, Fatigue, Fever, Night Sweats, Weight Gain and Weight Loss. Skin Not  Present- Change in Wart/Mole, Dryness, Hives, Jaundice, New Lesions, Non-Healing Wounds, Rash and Ulcer. HEENT Not Present- Earache, Hearing Loss, Hoarseness, Nose Bleed,  Oral Ulcers, Ringing in the Ears, Seasonal Allergies, Sinus Pain, Sore Throat, Visual Disturbances, Wears glasses/contact lenses and Yellow Eyes. Respiratory Not Present- Bloody sputum, Chronic Cough, Difficulty Breathing, Snoring and Wheezing. Breast Not Present- Breast Mass, Breast Pain, Nipple Discharge and Skin Changes. Cardiovascular Not Present- Chest Pain, Difficulty Breathing Lying Down, Leg Cramps, Palpitations, Rapid Heart Rate, Shortness of Breath and Swelling of Extremities. Gastrointestinal Present- Indigestion. Not Present- Abdominal Pain, Bloating, Bloody Stool, Change in Bowel Habits, Chronic diarrhea, Constipation, Difficulty Swallowing, Excessive gas, Gets full quickly at meals, Hemorrhoids, Nausea, Rectal Pain and Vomiting. [name] Genitourinary Not Present- Frequency, Nocturia, Painful Urination, Pelvic Pain and Urgency. Musculoskeletal Not Present- Back Pain, Joint Pain, Joint Stiffness, Muscle Pain, Muscle Weakness and Swelling of Extremities. Neurological Not Present- Decreased Memory, Fainting, Headaches, Numbness, Seizures, Tingling, Tremor, Trouble walking and Weakness. Psychiatric Present- Anxiety. Not Present- Bipolar, Change in Sleep Pattern, Depression, Fearful and Frequent crying. Endocrine Not Present- Cold Intolerance, Excessive Hunger, Hair Changes, Heat Intolerance, Hot flashes and New Diabetes. Hematology Not Present- Blood Thinners, Easy Bruising, Excessive bleeding, Gland problems, HIV and Persistent Infections.  V[employer] Karen [name] CMA; 06/21/2020 9:54 AM) 06/21/2020 9:54 AM Weight: 244.13 lb Height: 63in Body Surface Area: 2.1 m Body Mass Index: 43.Karen kg/m  Temp.: 98.92F(Oral)  Pulse: 92 (Regular)  BP: 136/82(Sitting, Left Arm, Standard)        Physical Exam (Purnell Daigle  A. Clovis Warwick MD; 06/21/2020 10:32 AM)  General Mental Status-Alert. General Appearance-Consistent with stated age. Hydration-Well hydrated. Voice-Normal.  Head and Neck Head-normocephalic, atraumatic with no lesions or palpable masses. Trachea-midline. Thyroid Gland Characteristics - normal size and consistency.  Eye Eyeball - Bilateral-Extraocular movements intact. Sclera/Conjunctiva - Bilateral-No scleral icterus.  Chest and Lung Exam Chest and lung exam reveals -quiet, even and easy respiratory effort with no use of accessory muscles and on auscultation, normal breath sounds, no adventitious sounds and normal vocal resonance. Inspection Chest Wall - Normal. Back - normal.  Breast Breast - Left-Symmetric, Non Tender, No Biopsy scars, no Dimpling - Left, No Inflammation, No Lumpectomy scars, No Mastectomy scars, No Peau d' Orange. Breast - Right-Symmetric, Non Tender, No Biopsy scars, no Dimpling - Right, No Inflammation, No Lumpectomy scars, No Mastectomy scars, No Peau d' Orange. Breast Lump-No Palpable Breast Mass.  Cardiovascular Cardiovascular examination reveals -normal heart sounds, regular rate and rhythm with no murmurs and normal pedal pulses bilaterally.  Abdomen Inspection Inspection of the abdomen reveals - No Hernias. Skin - Scar - no surgical scars. Palpation/Percussion Palpation and Percussion of the abdomen reveal - Soft, Non Tender, No Rebound tenderness, No Rigidity (guarding) and No hepatosplenomegaly. Auscultation Auscultation of the abdomen reveals - Bowel sounds normal.  Neurologic Neurologic evaluation reveals -alert and oriented x 3 with no impairment of recent or remote memory. Mental Status-Normal.  Musculoskeletal Normal Exam - Left-Upper Extremity Strength Normal and Lower Extremity Strength Normal. Normal Exam - Right-Upper Extremity Strength Normal and Lower Extremity Strength Normal.  Lymphatic Head &  Neck  General Head & Neck Lymphatics: Bilateral - Description - Normal. Axillary  General Axillary Region: Bilateral - Description - Normal. Tenderness - Non Tender. Femoral & Inguinal  Generalized Femoral & Inguinal Lymphatics: Bilateral - Description - Normal. Tenderness - Non Tender.    Assessment & Plan (Breylon Sherrow A. 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 Discussed the pros and cons of surgery as well as observation as an alternative.  There is a 1 and 5 chance of this being something more invasive. She has agreed to proceed.   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  You should hear from our office's scheduling department within 5 working days about the location, date, and time of surgery. We try to make accommodations for patient's preferences in scheduling surgery, but sometimes the OR schedule or the surgeon's schedule prevents [place] from making those accommodations.  If you have not heard from our office [phone]) in 5 working days, call the office and ask for your surgeon's nurse.  If you have other questions about your diagnosis, plan, or surgery, call the office and ask for your surgeon's nurse.  Pt Education - [facility] Breast Biopsy HCI: discussed with patient and provided information.

## 2020-06-21 NOTE — H&P (View-Only) (Signed)
Karen [name] Appointment: 06/21/2020 9:40 AM [facility] Patient #: [id number] DOB: [birth year] Married / Language: [place] / Race: White Female  History of Present Illness Karen Fus A. Lakhia Gengler MD; 06/21/2020 10:32 AM) Patient words: 44 year old female recalled from screening mammogram dated 05/02/2020 for a possible right breast asymmetry. Patient denies any history of breast pain, nipple discharge or previous breast biopsies. There is no family history of breast cancer. Imaging revealed an area in the right breast upper outer quadrant roughly 8 cm in maximal diameter core biopsy shown to be a radial scar with atypical lobular hyperplasia.  EXAM: DIGITAL DIAGNOSTIC RIGHT MAMMOGRAM WITH CAD AND TOMO  ULTRASOUND RIGHT BREAST  COMPARISON: Previous exam(s).  ACR Breast Density Category b: There are scattered areas of fibroglandular density.  FINDINGS: Persistent linear, asymmetry with questioned associated distortion is demonstrated in the lateral right breast at mid depth. A few circumscribed masses are noted within the asymmetry. Further evaluation with ultrasound was performed.  Mammographic images were processed with CAD.  Targeted ultrasound is performed, showing multiple hypoechoic masses in a linear orientation with the 8 o'clock position 5 cm from the nipple on the right. The largest mass demonstrates an internal calcification and measures 8 x 6 x 4 cm. The appearance is suggestive of a dilated duct, or stacked duct appearance.  Evaluation of the right axilla demonstrates no suspicious lymphadenopathy.  IMPRESSION: 1. Multiple indeterminate masses in a linear orientation in the lateral right breast. The findings may suggest a stacked duct appearance. Recommendation is for ultrasound-guided biopsy. 2. No suspicious right axillary lymphadenopathy.  RECOMMENDATION: Ultrasound-guided biopsy of the right breast.  I have discussed the findings and  recommendations with the patient. If applicable, a reminder letter will be sent to the patient regarding the next appointment.  BI-RADS CATEGORY 4: Suspicious.   Electronically Signed By: Sande Brothers M.D. On: 05/17/2020 16:28           Diagnosis Breast, right, needle core biopsy, 8 o'clock - COMPLEX SCLEROSING LESION. - FOCAL ATYPICAL LOBULAR HYPERPLASIA. - FIBROCYSTIC CHANGES WITH USUAL DUCTAL HYPERPLASIA.  The patient is a 44 year old female.   Past Surgical History Marcelino Duster R. Brooks, CMA; 06/21/2020 9:55 AM) Gallbladder Surgery - Laparoscopic  Diagnostic Studies History Marcelino Duster R. Brooks, CMA; 06/21/2020 9:55 AM) Colonoscopy never Mammogram within last year Pap Smear 1-5 years ago  Allergies Marcelino Duster R. Brooks, CMA; 06/21/2020 9:55 AM) Penicillin G Potassium *PENICILLINS*  Medication History (Michelle R. Brooks, CMA; 06/21/2020 9:56 AM) Levothyroxine Sodium ( Tablet, Oral) Active. Pantoprazole Sodium (40MG  Tablet DR, Oral) Active. Escitalopram Oxalate (10MG  Tablet, Oral) Active. Aspirin (81MG  Tablet, Oral) Active. Omega 3 (Oral) Specific strength unknown - Active. Medications Reconciled  Social History R. Brooks, CMA; 06/21/2020 9:55 AM) Alcohol use Occasional alcohol use. Caffeine use Coffee. No drug use Tobacco use Former smoker.  Family History R. Marcelino Duster, CMA; 06/21/2020 9:55 AM) Diabetes Mellitus Mother. Hypertension Father, Mother.  Pregnancy / Birth History Marcelino Duster R. Shon Baton, CMA; 06/21/2020 9:55 AM) Age at menarche 13 years. Gravida 4 Length (months) of breastfeeding 3-6 Maternal age 75-20 Para 3 Regular periods  Other Problems Shon Baton R. Brooks, CMA; 06/21/2020 9:55 AM) Anxiety Disorder Thyroid Disease     Review of Systems [facility] R. Brooks CMA; 06/21/2020 9:55 AM) General Not Present- Appetite Loss, Chills, Fatigue, Fever, Night Sweats, Weight Gain and Weight Loss. Skin Not  Present- Change in Wart/Mole, Dryness, Hives, Jaundice, New Lesions, Non-Healing Wounds, Rash and Ulcer. HEENT Not Present- Earache, Hearing Loss, Hoarseness, Nose Bleed,  Oral Ulcers, Ringing in the Ears, Seasonal Allergies, Sinus Pain, Sore Throat, Visual Disturbances, Wears glasses/contact lenses and Yellow Eyes. Respiratory Not Present- Bloody sputum, Chronic Cough, Difficulty Breathing, Snoring and Wheezing. Breast Not Present- Breast Mass, Breast Pain, Nipple Discharge and Skin Changes. Cardiovascular Not Present- Chest Pain, Difficulty Breathing Lying Down, Leg Cramps, Palpitations, Rapid Heart Rate, Shortness of Breath and Swelling of Extremities. Gastrointestinal Present- Indigestion. Not Present- Abdominal Pain, Bloating, Bloody Stool, Change in Bowel Habits, Chronic diarrhea, Constipation, Difficulty Swallowing, Excessive gas, Gets full quickly at meals, Hemorrhoids, Nausea, Rectal Pain and Vomiting. Female Genitourinary Not Present- Frequency, Nocturia, Painful Urination, Pelvic Pain and Urgency. Musculoskeletal Not Present- Back Pain, Joint Pain, Joint Stiffness, Muscle Pain, Muscle Weakness and Swelling of Extremities. Neurological Not Present- Decreased Memory, Fainting, Headaches, Numbness, Seizures, Tingling, Tremor, Trouble walking and Weakness. Psychiatric Present- Anxiety. Not Present- Bipolar, Change in Sleep Pattern, Depression, Fearful and Frequent crying. Endocrine Not Present- Cold Intolerance, Excessive Hunger, Hair Changes, Heat Intolerance, Hot flashes and New Diabetes. Hematology Not Present- Blood Thinners, Easy Bruising, Excessive bleeding, Gland problems, HIV and Persistent Infections.  Vitals [employer] R. Brooks CMA; 06/21/2020 9:54 AM) 06/21/2020 9:54 AM Weight: 244.13 lb Height: 63in Body Surface Area: 2.1 m Body Mass Index: 43.24 kg/m  Temp.: 98.92F(Oral)  Pulse: 92 (Regular)  BP: 136/82(Sitting, Left Arm, Standard)        Physical Exam (Maisyn Nouri  A. Shanaiya Bene MD; 06/21/2020 10:32 AM)  General Mental Status-Alert. General Appearance-Consistent with stated age. Hydration-Well hydrated. Voice-Normal.  Head and Neck Head-normocephalic, atraumatic with no lesions or palpable masses. Trachea-midline. Thyroid Gland Characteristics - normal size and consistency.  Eye Eyeball - Bilateral-Extraocular movements intact. Sclera/Conjunctiva - Bilateral-No scleral icterus.  Chest and Lung Exam Chest and lung exam reveals -quiet, even and easy respiratory effort with no use of accessory muscles and on auscultation, normal breath sounds, no adventitious sounds and normal vocal resonance. Inspection Chest Wall - Normal. Back - normal.  Breast Breast - Left-Symmetric, Non Tender, No Biopsy scars, no Dimpling - Left, No Inflammation, No Lumpectomy scars, No Mastectomy scars, No Peau d' Orange. Breast - Right-Symmetric, Non Tender, No Biopsy scars, no Dimpling - Right, No Inflammation, No Lumpectomy scars, No Mastectomy scars, No Peau d' Orange. Breast Lump-No Palpable Breast Mass.  Cardiovascular Cardiovascular examination reveals -normal heart sounds, regular rate and rhythm with no murmurs and normal pedal pulses bilaterally.  Abdomen Inspection Inspection of the abdomen reveals - No Hernias. Skin - Scar - no surgical scars. Palpation/Percussion Palpation and Percussion of the abdomen reveal - Soft, Non Tender, No Rebound tenderness, No Rigidity (guarding) and No hepatosplenomegaly. Auscultation Auscultation of the abdomen reveals - Bowel sounds normal.  Neurologic Neurologic evaluation reveals -alert and oriented x 3 with no impairment of recent or remote memory. Mental Status-Normal.  Musculoskeletal Normal Exam - Left-Upper Extremity Strength Normal and Lower Extremity Strength Normal. Normal Exam - Right-Upper Extremity Strength Normal and Lower Extremity Strength Normal.  Lymphatic Head &  Neck  General Head & Neck Lymphatics: Bilateral - Description - Normal. Axillary  General Axillary Region: Bilateral - Description - Normal. Tenderness - Non Tender. Femoral & Inguinal  Generalized Femoral & Inguinal Lymphatics: Bilateral - Description - Normal. Tenderness - Non Tender.    Assessment & Plan (Tyqwan Pink A. 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 Discussed the pros and cons of surgery as well as observation as an alternative.  There is a 1 and 5 chance of this being something more invasive. She has agreed to proceed.   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  You should hear from our office's scheduling department within 5 working days about the location, date, and time of surgery. We try to make accommodations for patient's preferences in scheduling surgery, but sometimes the OR schedule or the surgeon's schedule prevents [place] from making those accommodations.  If you have not heard from our office [phone]) in 5 working days, call the office and ask for your surgeon's nurse.  If you have other questions about your diagnosis, plan, or surgery, call the office and ask for your surgeon's nurse.  Pt Education - [facility] Breast Biopsy HCI: discussed with patient and provided information.

## 2020-06-28 ENCOUNTER — Other Ambulatory Visit: Payer: Self-pay | Admitting: Surgery

## 2020-06-28 DIAGNOSIS — N6489 Other specified disorders of breast: Secondary | ICD-10-CM

## 2020-07-08 ENCOUNTER — Other Ambulatory Visit: Payer: Self-pay | Admitting: Surgery

## 2020-07-13 ENCOUNTER — Encounter (HOSPITAL_BASED_OUTPATIENT_CLINIC_OR_DEPARTMENT_OTHER): Payer: Self-pay | Admitting: Surgery

## 2020-07-13 ENCOUNTER — Other Ambulatory Visit: Payer: Self-pay

## 2020-07-15 MED FILL — PANTOPRAZOLE SOD DR 40 MG T: 40 | 90 days supply | Qty: 90 | Fill #1

## 2020-07-15 MED FILL — ESCITALOPRAM 10 MG TABLET: 10 | 90 days supply | Qty: 90 | Fill #1

## 2020-07-16 ENCOUNTER — Other Ambulatory Visit (HOSPITAL_COMMUNITY)
Admission: RE | Admit: 2020-07-16 | Discharge: 2020-07-16 | Disposition: A | Discharge status: Home / Self Care | Payer: No Typology Code available for payment source | Source: Ambulatory Visit | Attending: Surgery | Admitting: Surgery

## 2020-07-16 DIAGNOSIS — Z20822 Contact with and (suspected) exposure to covid-19: Secondary | ICD-10-CM | POA: Diagnosis not present

## 2020-07-16 DIAGNOSIS — Z01812 Encounter for preprocedural laboratory examination: Secondary | ICD-10-CM | POA: Diagnosis present

## 2020-07-16 LAB — SARS CORONAVIRUS 2 (TAT 6-24 HRS): SARS Coronavirus 2: NEGATIVE

## 2020-07-19 ENCOUNTER — Other Ambulatory Visit: Payer: Self-pay

## 2020-07-19 ENCOUNTER — Ambulatory Visit
Admission: RE | Admit: 2020-07-19 | Discharge: 2020-07-19 | Disposition: A | Discharge status: Home / Self Care | Payer: No Typology Code available for payment source | Source: Ambulatory Visit | Attending: Surgery | Admitting: Surgery

## 2020-07-19 DIAGNOSIS — N6489 Other specified disorders of breast: Secondary | ICD-10-CM

## 2020-07-19 NOTE — Anesthesia Preprocedure Evaluation (Addendum)
Anesthesia Evaluation  Patient identified by MRN, date of birth, ID band Patient awake    Reviewed: Allergy & Precautions, NPO status , Patient's Chart, lab work & pertinent test results  History of Anesthesia Complications Negative for: history of anesthetic complications  Airway Mallampati: III  TM Distance: >3 FB Neck ROM: Full    Dental  (+) Dental Advisory Given, Teeth Intact   Pulmonary former smoker,    Pulmonary exam normal        Cardiovascular negative cardio ROS Normal cardiovascular exam     Neuro/Psych PSYCHIATRIC DISORDERS Anxiety Depression negative neurological ROS     GI/Hepatic Neg liver ROS, GERD  Medicated and Controlled,  Endo/Other  Hypothyroidism Morbid obesity  Renal/GU negative Renal ROS     Musculoskeletal negative musculoskeletal ROS (+)   Abdominal   Peds  Hematology negative hematology ROS (+)   Anesthesia Other Findings Covid test negative   Reproductive/Obstetrics                            Anesthesia Physical Anesthesia Plan  ASA: III  Anesthesia Plan: General   Post-op Pain Management:    Induction: Intravenous  PONV Risk Score and Plan: 4 or greater and Treatment may vary due to age or medical condition, Ondansetron, Midazolam and Dexamethasone  Airway Management Planned: LMA  Additional Equipment: None  Intra-op Plan:   Post-operative Plan: Extubation in OR  Informed Consent: I have reviewed the patients History and Physical, chart, labs and discussed the procedure including the risks, benefits and alternatives for the proposed anesthesia with the patient or authorized representative who has indicated his/her understanding and acceptance.     Dental advisory given  Plan Discussed with: CRNA and Anesthesiologist  Anesthesia Plan Comments:        Anesthesia Quick Evaluation

## 2020-07-19 NOTE — Progress Notes (Signed)

## 2020-07-20 ENCOUNTER — Other Ambulatory Visit: Payer: Self-pay

## 2020-07-20 ENCOUNTER — Other Ambulatory Visit (HOSPITAL_BASED_OUTPATIENT_CLINIC_OR_DEPARTMENT_OTHER): Payer: Self-pay | Admitting: Surgery

## 2020-07-20 ENCOUNTER — Ambulatory Visit (HOSPITAL_BASED_OUTPATIENT_CLINIC_OR_DEPARTMENT_OTHER)
Admission: RE | Admit: 2020-07-20 | Discharge: 2020-07-20 | Disposition: A | Discharge status: Home / Self Care | Payer: No Typology Code available for payment source | Attending: Surgery | Admitting: Surgery

## 2020-07-20 ENCOUNTER — Ambulatory Visit (HOSPITAL_BASED_OUTPATIENT_CLINIC_OR_DEPARTMENT_OTHER): Payer: No Typology Code available for payment source | Admitting: Anesthesiology

## 2020-07-20 ENCOUNTER — Encounter (HOSPITAL_BASED_OUTPATIENT_CLINIC_OR_DEPARTMENT_OTHER): Payer: Self-pay | Admitting: Surgery

## 2020-07-20 ENCOUNTER — Ambulatory Visit
Admission: RE | Admit: 2020-07-20 | Discharge: 2020-07-20 | Disposition: A | Discharge status: Home / Self Care | Payer: No Typology Code available for payment source | Source: Ambulatory Visit | Attending: Surgery | Admitting: Surgery

## 2020-07-20 ENCOUNTER — Encounter (HOSPITAL_BASED_OUTPATIENT_CLINIC_OR_DEPARTMENT_OTHER)
Admission: RE | Disposition: A | Discharge status: Home / Self Care | Payer: Self-pay | Source: Home / Self Care | Attending: Surgery

## 2020-07-20 DIAGNOSIS — E039 Hypothyroidism, unspecified: Secondary | ICD-10-CM | POA: Insufficient documentation

## 2020-07-20 DIAGNOSIS — N6489 Other specified disorders of breast: Secondary | ICD-10-CM

## 2020-07-20 DIAGNOSIS — Z833 Family history of diabetes mellitus: Secondary | ICD-10-CM | POA: Insufficient documentation

## 2020-07-20 DIAGNOSIS — L905 Scar conditions and fibrosis of skin: Secondary | ICD-10-CM | POA: Diagnosis not present

## 2020-07-20 DIAGNOSIS — F419 Anxiety disorder, unspecified: Secondary | ICD-10-CM | POA: Insufficient documentation

## 2020-07-20 DIAGNOSIS — K219 Gastro-esophageal reflux disease without esophagitis: Secondary | ICD-10-CM | POA: Diagnosis not present

## 2020-07-20 DIAGNOSIS — F32A Depression, unspecified: Secondary | ICD-10-CM | POA: Insufficient documentation

## 2020-07-20 DIAGNOSIS — Z87891 Personal history of nicotine dependence: Secondary | ICD-10-CM | POA: Diagnosis not present

## 2020-07-20 DIAGNOSIS — Z6841 Body Mass Index (BMI) 40.0 and over, adult: Secondary | ICD-10-CM | POA: Insufficient documentation

## 2020-07-20 DIAGNOSIS — Z9049 Acquired absence of other specified parts of digestive tract: Secondary | ICD-10-CM | POA: Diagnosis not present

## 2020-07-20 DIAGNOSIS — Z79899 Other long term (current) drug therapy: Secondary | ICD-10-CM | POA: Diagnosis not present

## 2020-07-20 DIAGNOSIS — Z880 Allergy status to penicillin: Secondary | ICD-10-CM | POA: Diagnosis not present

## 2020-07-20 DIAGNOSIS — Z7982 Long term (current) use of aspirin: Secondary | ICD-10-CM | POA: Insufficient documentation

## 2020-07-20 DIAGNOSIS — Z8249 Family history of ischemic heart disease and other diseases of the circulatory system: Secondary | ICD-10-CM | POA: Insufficient documentation

## 2020-07-20 HISTORY — PX: BREAST LUMPECTOMY WITH RADIOACTIVE SEED LOCALIZATION: SHX6424

## 2020-07-20 LAB — POCT PREGNANCY, URINE: Preg Test, Ur: NEGATIVE

## 2020-07-20 SURGERY — BREAST LUMPECTOMY WITH RADIOACTIVE SEED LOCALIZATION
Anesthesia: General | Site: Breast | Laterality: Right

## 2020-07-20 MED ORDER — FENTANYL CITRATE (PF) 100 MCG/2ML IJ SOLN
INTRAMUSCULAR | Status: AC
  Filled 2020-07-20: qty 2

## 2020-07-20 MED ORDER — CELECOXIB 200 MG PO CAPS
ORAL_CAPSULE | ORAL | Status: AC
  Filled 2020-07-20: qty 1

## 2020-07-20 MED ORDER — GABAPENTIN 300 MG PO CAPS
300.0000 mg | ORAL_CAPSULE | ORAL | Status: AC
  Administered 2020-07-20: 300 mg via ORAL

## 2020-07-20 MED ORDER — OXYCODONE HCL 5 MG/5ML PO SOLN: 5.0000 mg | Freq: Once | ORAL | Status: DC | PRN

## 2020-07-20 MED ORDER — PROMETHAZINE HCL 25 MG/ML IJ SOLN: 6.2500 mg | INTRAMUSCULAR | Status: DC | PRN

## 2020-07-20 MED ORDER — 0.9 % SODIUM CHLORIDE (POUR BTL) OPTIME
TOPICAL | Status: DC | PRN
  Administered 2020-07-20: 200 mL

## 2020-07-20 MED ORDER — ACETAMINOPHEN 500 MG PO TABS
1000.0000 mg | ORAL_TABLET | ORAL | Status: AC
  Administered 2020-07-20: 1000 mg via ORAL

## 2020-07-20 MED ORDER — LIDOCAINE 2% (20 MG/ML) 5 ML SYRINGE
INTRAMUSCULAR | Status: AC
  Filled 2020-07-20: qty 5

## 2020-07-20 MED ORDER — CHLORHEXIDINE GLUCONATE CLOTH 2 % EX PADS: 6.0000 | MEDICATED_PAD | Freq: Once | CUTANEOUS | Status: DC

## 2020-07-20 MED ORDER — BUPIVACAINE HCL (PF) 0.25 % IJ SOLN
INTRAMUSCULAR | Status: AC
  Filled 2020-07-20: qty 60

## 2020-07-20 MED ORDER — HYDROCODONE-ACETAMINOPHEN 5-325 MG PO TABS: 1.0000 | ORAL_TABLET | Freq: Four times a day (QID) | ORAL | 0 refills | Status: DC | PRN

## 2020-07-20 MED ORDER — IBUPROFEN 800 MG PO TABS: 800.0000 mg | ORAL_TABLET | Freq: Three times a day (TID) | ORAL | 0 refills | Status: DC | PRN

## 2020-07-20 MED ORDER — GABAPENTIN 300 MG PO CAPS
ORAL_CAPSULE | ORAL | Status: AC
  Filled 2020-07-20: qty 1

## 2020-07-20 MED ORDER — BUPIVACAINE HCL (PF) 0.25 % IJ SOLN
INTRAMUSCULAR | Status: AC
  Filled 2020-07-20: qty 30

## 2020-07-20 MED ORDER — SODIUM CHLORIDE (PF) 0.9 % IJ SOLN
INTRAMUSCULAR | Status: AC
  Filled 2020-07-20: qty 10

## 2020-07-20 MED ORDER — BUPIVACAINE HCL (PF) 0.25 % IJ SOLN
INTRAMUSCULAR | Status: DC | PRN
  Administered 2020-07-20: 18 mL

## 2020-07-20 MED ORDER — ONDANSETRON HCL 4 MG/2ML IJ SOLN
INTRAMUSCULAR | Status: AC
  Filled 2020-07-20: qty 2

## 2020-07-20 MED ORDER — FENTANYL CITRATE (PF) 100 MCG/2ML IJ SOLN
INTRAMUSCULAR | Status: DC | PRN
  Administered 2020-07-20: 50 ug via INTRAVENOUS
  Administered 2020-07-20 (×2): 25 ug via INTRAVENOUS

## 2020-07-20 MED ORDER — FENTANYL CITRATE (PF) 100 MCG/2ML IJ SOLN: 25.0000 ug | INTRAMUSCULAR | Status: DC | PRN

## 2020-07-20 MED ORDER — CELECOXIB 200 MG PO CAPS
200.0000 mg | ORAL_CAPSULE | ORAL | Status: AC
  Administered 2020-07-20: 200 mg via ORAL

## 2020-07-20 MED ORDER — DEXAMETHASONE SODIUM PHOSPHATE 10 MG/ML IJ SOLN
INTRAMUSCULAR | Status: AC
  Filled 2020-07-20: qty 1

## 2020-07-20 MED ORDER — CLINDAMYCIN PHOSPHATE 900 MG/50ML IV SOLN
INTRAVENOUS | Status: AC
  Filled 2020-07-20: qty 50

## 2020-07-20 MED ORDER — PROPOFOL 500 MG/50ML IV EMUL
INTRAVENOUS | Status: AC
  Filled 2020-07-20: qty 50

## 2020-07-20 MED ORDER — ACETAMINOPHEN 500 MG PO TABS
ORAL_TABLET | ORAL | Status: AC
  Filled 2020-07-20: qty 2

## 2020-07-20 MED ORDER — LIDOCAINE HCL (CARDIAC) PF 100 MG/5ML IV SOSY
PREFILLED_SYRINGE | INTRAVENOUS | Status: DC | PRN
  Administered 2020-07-20: 60 mg via INTRAVENOUS

## 2020-07-20 MED ORDER — MIDAZOLAM HCL 5 MG/5ML IJ SOLN
INTRAMUSCULAR | Status: DC | PRN
  Administered 2020-07-20: 2 mg via INTRAVENOUS

## 2020-07-20 MED ORDER — ONDANSETRON HCL 4 MG/2ML IJ SOLN
INTRAMUSCULAR | Status: DC | PRN
  Administered 2020-07-20: 4 mg via INTRAVENOUS

## 2020-07-20 MED ORDER — BUPIVACAINE HCL (PF) 0.5 % IJ SOLN
INTRAMUSCULAR | Status: AC
  Filled 2020-07-20: qty 60

## 2020-07-20 MED ORDER — DEXAMETHASONE SODIUM PHOSPHATE 4 MG/ML IJ SOLN
INTRAMUSCULAR | Status: DC | PRN
  Administered 2020-07-20: 5 mg via INTRAVENOUS

## 2020-07-20 MED ORDER — EPHEDRINE SULFATE 50 MG/ML IJ SOLN
INTRAMUSCULAR | Status: DC | PRN
  Administered 2020-07-20: 10 mg via INTRAVENOUS

## 2020-07-20 MED ORDER — METHYLENE BLUE 0.5 % INJ SOLN
INTRAVENOUS | Status: AC
  Filled 2020-07-20: qty 10

## 2020-07-20 MED ORDER — OXYCODONE HCL 5 MG PO TABS: 5.0000 mg | ORAL_TABLET | Freq: Once | ORAL | Status: DC | PRN

## 2020-07-20 MED ORDER — PROPOFOL 10 MG/ML IV BOLUS
INTRAVENOUS | Status: DC | PRN
  Administered 2020-07-20: 100 mg via INTRAVENOUS
  Administered 2020-07-20: 200 mg via INTRAVENOUS

## 2020-07-20 MED ORDER — LACTATED RINGERS IV SOLN: INTRAVENOUS | Status: DC

## 2020-07-20 MED ORDER — CLINDAMYCIN PHOSPHATE 900 MG/50ML IV SOLN
900.0000 mg | INTRAVENOUS | Status: AC
  Administered 2020-07-20: 900 mg via INTRAVENOUS

## 2020-07-20 MED ORDER — MIDAZOLAM HCL 2 MG/2ML IJ SOLN
INTRAMUSCULAR | Status: AC
  Filled 2020-07-20: qty 2

## 2020-07-20 MED FILL — HYDROCODON-APAP 5-325: 5-325 | 4 days supply | Qty: 15 | Fill #0

## 2020-07-20 SURGICAL SUPPLY — 55 items
ADH SKN CLS APL DERMABOND .7 (GAUZE/BANDAGES/DRESSINGS) ×1
APL PRP STRL LF DISP 70% ISPRP (MISCELLANEOUS) ×1
APPLIER CLIP 9.375 MED OPEN (MISCELLANEOUS)
APR CLP MED 9.3 20 MLT OPN (MISCELLANEOUS)
BINDER BREAST LRG (GAUZE/BANDAGES/DRESSINGS) IMPLANT
BINDER BREAST MEDIUM (GAUZE/BANDAGES/DRESSINGS) IMPLANT
BINDER BREAST XLRG (GAUZE/BANDAGES/DRESSINGS) IMPLANT
BINDER BREAST XXLRG (GAUZE/BANDAGES/DRESSINGS) ×3 IMPLANT
BLADE SURG 15 STRL LF DISP TIS (BLADE) ×1 IMPLANT
BLADE SURG 15 STRL SS (BLADE) ×3
CANISTER SUC SOCK COL 7IN (MISCELLANEOUS) IMPLANT
CANISTER SUCT 1200ML W/VALVE (MISCELLANEOUS) ×3 IMPLANT
CHLORAPREP W/TINT 26 (MISCELLANEOUS) ×3 IMPLANT
CLIP APPLIE 9.375 MED OPEN (MISCELLANEOUS) IMPLANT
COVER BACK TABLE 60X90IN (DRAPES) ×3 IMPLANT
COVER MAYO STAND STRL (DRAPES) ×3 IMPLANT
COVER PROBE W GEL 5X96 (DRAPES) ×3 IMPLANT
COVER WAND RF STERILE (DRAPES) IMPLANT
DECANTER SPIKE VIAL GLASS SM (MISCELLANEOUS) IMPLANT
DERMABOND ADVANCED (GAUZE/BANDAGES/DRESSINGS) ×2
DERMABOND ADVANCED .7 DNX12 (GAUZE/BANDAGES/DRESSINGS) ×1 IMPLANT
DRAPE LAPAROSCOPIC ABDOMINAL (DRAPES) ×3 IMPLANT
DRAPE LAPAROTOMY 100X72 PEDS (DRAPES) IMPLANT
DRAPE UTILITY XL STRL (DRAPES) ×3 IMPLANT
ELECT COATED BLADE 2.86 ST (ELECTRODE) ×3 IMPLANT
ELECT REM PT RETURN 9FT ADLT (ELECTROSURGICAL) ×3
ELECTRODE REM PT RTRN 9FT ADLT (ELECTROSURGICAL) ×1 IMPLANT
GLOVE BIOGEL PI IND STRL 7.0 (GLOVE) ×2 IMPLANT
GLOVE BIOGEL PI IND STRL 8 (GLOVE) ×1 IMPLANT
GLOVE BIOGEL PI INDICATOR 7.0 (GLOVE) ×4
GLOVE BIOGEL PI INDICATOR 8 (GLOVE) ×2
GLOVE ECLIPSE 6.5 STRL STRAW (GLOVE) ×3 IMPLANT
GLOVE ECLIPSE 8.0 STRL XLNG CF (GLOVE) ×3 IMPLANT
GOWN STRL REUS W/ TWL LRG LVL3 (GOWN DISPOSABLE) ×1 IMPLANT
GOWN STRL REUS W/ TWL XL LVL3 (GOWN DISPOSABLE) ×1 IMPLANT
GOWN STRL REUS W/TWL LRG LVL3 (GOWN DISPOSABLE) ×3
GOWN STRL REUS W/TWL XL LVL3 (GOWN DISPOSABLE) ×3
HEMOSTAT ARISTA ABSORB 3G PWDR (HEMOSTASIS) IMPLANT
HEMOSTAT SNOW SURGICEL 2X4 (HEMOSTASIS) IMPLANT
KIT MARKER MARGIN INK (KITS) ×3 IMPLANT
NEEDLE HYPO 25X1 1.5 SAFETY (NEEDLE) ×3 IMPLANT
NS IRRIG 1000ML POUR BTL (IV SOLUTION) ×3 IMPLANT
PACK BASIN DAY SURGERY FS (CUSTOM PROCEDURE TRAY) ×3 IMPLANT
PENCIL SMOKE EVACUATOR (MISCELLANEOUS) ×3 IMPLANT
SLEEVE SCD COMPRESS KNEE MED (MISCELLANEOUS) ×3 IMPLANT
SPONGE LAP 4X18 RFD (DISPOSABLE) ×3 IMPLANT
SUT MNCRL AB 4-0 PS2 18 (SUTURE) ×3 IMPLANT
SUT SILK 2 0 SH (SUTURE) IMPLANT
SUT VICRYL 3-0 CR8 SH (SUTURE) ×3 IMPLANT
SYR CONTROL 10ML LL (SYRINGE) ×3 IMPLANT
TOWEL GREEN STERILE FF (TOWEL DISPOSABLE) ×3 IMPLANT
TRAY FAXITRON CT DISP (TRAY / TRAY PROCEDURE) ×3 IMPLANT
TUBE CONNECTING 20'X1/4 (TUBING) ×1
TUBE CONNECTING 20X1/4 (TUBING) ×2 IMPLANT
YANKAUER SUCT BULB TIP NO VENT (SUCTIONS) ×3 IMPLANT

## 2020-07-20 NOTE — Discharge Instructions (Signed)
[facility] Office Phone Number [phone]  BREAST BIOPSY/ PARTIAL MASTECTOMY: POST OP INSTRUCTIONS  Always review your discharge instruction sheet given to you by the facility where your surgery was performed.  IF YOU HAVE DISABILITY OR FAMILY LEAVE FORMS, YOU MUST BRING THEM TO THE OFFICE FOR PROCESSING.  DO NOT GIVE THEM TO YOUR DOCTOR.  1. A prescription for pain medication may be given to you upon discharge.  Take your pain medication as prescribed, if needed.  If narcotic pain medicine is not needed, then you may take acetaminophen (Tylenol) or ibuprofen (Advil) as needed. Do not take Tylenol or Ibuprofen until 1:50 pm as needed. 2. Take your usually prescribed medications unless otherwise directed 3. If you need a refill on your pain medication, please contact your pharmacy.  They will contact our office to request authorization.  Prescriptions will not be filled after 5pm or on week-ends. 4. You should eat very light the first 24 hours after surgery, such as soup, crackers, pudding, etc.  Resume your normal diet the day after surgery. 5. Most patients will experience some swelling and bruising in the breast.  Ice packs and a good support bra will help.  Swelling and bruising can take several days to resolve.  6. It is common to experience some constipation if taking pain medication after surgery.  Increasing fluid intake and taking a stool softener will usually help or prevent this problem from occurring.  A mild laxative (Milk of Magnesia or Miralax) should be taken according to package directions if there are no bowel movements after 48 hours. 7. Unless discharge instructions indicate otherwise, you may remove your bandages 24-48 hours after surgery, and you may shower at that time.  You may have steri-strips (small skin tapes) in place directly over the incision.  These strips should be left on the skin for 7-10 days.  If your surgeon used skin glue on the incision, you  may shower in 24 hours.  The glue will flake off over the next 2-3 weeks.  Any sutures or staples will be removed at the office during your follow-up visit. 8. ACTIVITIES:  You may resume regular daily activities (gradually increasing) beginning the next day.  Wearing a good support bra or sports bra minimizes pain and swelling.  You may have sexual intercourse when it is comfortable. a. You may drive when you no longer are taking prescription pain medication, you can comfortably wear a seatbelt, and you can safely maneuver your car and apply brakes. b. RETURN TO WORK:  ______________________________________________________________________________________ 9. You should see your doctor in the office for a follow-up appointment approximately two weeks after your surgery.  Your doctor's nurse will typically make your follow-up appointment when she calls you with your pathology report.  Expect your pathology report 2-3 business days after your surgery.  You may call to check if you do not hear from [place] after three days. 10. OTHER INSTRUCTIONS: _______________________________________________________________________________________________ _____________________________________________________________________________________________________________________________________ _____________________________________________________________________________________________________________________________________ _____________________________________________________________________________________________________________________________________  WHEN TO CALL YOUR DOCTOR: 1. Fever over 101.0 2. Nausea and/or vomiting. 3. Extreme swelling or bruising. 4. Continued bleeding from incision. 5. Increased pain, redness, or drainage from the incision.  The clinic staff is available to answer your questions during regular business hours.  Please don't hesitate to call and ask to speak to one of the nurses for clinical  concerns.  If you have a medical emergency, go to the nearest emergency room or call 911.  A surgeon from [facility] is always on call  at the hospital.  For further questions, please visit [email or web address]    Post Anesthesia Home Care Instructions  Activity: Get plenty of rest for the remainder of the day. A responsible individual must stay with you for 24 hours following the procedure.  For the next 24 hours, DO NOT: -Drive a car -Advertising copywriter -Drink alcoholic beverages -Take any medication unless instructed by your physician -Make any legal decisions or sign important papers.  Meals: Start with liquid foods such as gelatin or soup. Progress to regular foods as tolerated. Avoid greasy, spicy, heavy foods. If nausea and/or vomiting occur, drink only clear liquids until the nausea and/or vomiting subsides. Call your physician if vomiting continues.  Special Instructions/Symptoms: Your throat may feel dry or sore from the anesthesia or the breathing tube placed in your throat during surgery. If this causes discomfort, gargle with warm salt water. The discomfort should disappear within 24 hours.  If you had a scopolamine patch placed behind your ear for the management of post- operative nausea and/or vomiting:  1. The medication in the patch is effective for 72 hours, after which it should be removed.  Wrap patch in a tissue and discard in the trash. Wash hands thoroughly with soap and water. 2. You may remove the patch earlier than 72 hours if you experience unpleasant side effects which may include dry mouth, dizziness or visual disturbances. 3. Avoid touching the patch. Wash your hands with soap and water after contact with the patch.

## 2020-07-20 NOTE — Anesthesia Postprocedure Evaluation (Signed)
Anesthesia Post Note  Patient: Karen [name]  Procedure(s) Performed: RIGHT BREAST LUMPECTOMY WITH RADIOACTIVE SEED LOCALIZATION (Right Breast)     Patient location during evaluation: PACU Anesthesia Type: General Level of consciousness: awake and alert Pain management: pain level controlled Vital Signs Assessment: post-procedure vital signs reviewed and stable Respiratory status: spontaneous breathing, nonlabored ventilation and respiratory function stable Cardiovascular status: blood pressure returned to baseline and stable Postop Assessment: no apparent nausea or vomiting Anesthetic complications: no   No complications documented.  Last Vitals:  Vitals:   07/20/20 0955 07/20/20 1010  BP: 112/70 122/74  Pulse: 75 75  Resp: 16 18  Temp:  36.6 C  SpO2: 100% 98%    Last Pain:  Vitals:   07/20/20 1010  TempSrc:   PainSc: 0-No pain                 Beryle Lathe

## 2020-07-20 NOTE — Anesthesia Procedure Notes (Signed)
Procedure Name: LMA Insertion Date/Time: 07/20/2020 8:35 AM Performed by: Burna Cash, CRNA Pre-anesthesia Checklist: Patient identified, Emergency Drugs available, Suction available and Patient being monitored Patient Re-evaluated:Patient Re-evaluated prior to induction Oxygen Delivery Method: Circle system utilized Preoxygenation: Pre-oxygenation with 100% oxygen Induction Type: IV induction Ventilation: Mask ventilation without difficulty LMA: LMA inserted LMA Size: 4.0 Number of attempts: 1 Airway Equipment and Method: Bite block Placement Confirmation: positive ETCO2 Tube secured with: Tape Dental Injury: Teeth and Oropharynx as per pre-operative assessment

## 2020-07-20 NOTE — Op Note (Signed)
Preoperative diagnosis: Right breast radial scar  Postoperative diagnosis: Same  Procedure: Right breast seed localized lumpectomy  Surgeon: Erroll Luna, MD  Anesthesia: General with 0.25% Marcaine local  EBL: Minimal  Specimen: Right breast tissue with seed and clip verified by Faxitron  Drains: None  IV fluids: Per anesthesia record  Indications for procedure: The patient is a 44 year old female who underwent screening mammogram and subsequent diagnostic mammography and core biopsy which showed a right breast radial scar.  We discussed the pros and cons of removing this and the rationale for removing it with potential upgrade risk to malignancy being up to 20% in the literature.  Observation was also discussed.  She opted for right breast seed localized lumpectomy given the potential risk.The procedure has been discussed with the patient. Alternatives to surgery have been discussed with the patient.  Risks of surgery include bleeding,  Infection,  Seroma formation, death,  and the need for further surgery.   The patient understands and wishes to proceed.  Description of procedure: The patient was met in the holding area and questions were answered.  Neoprobe was used to verify seed location in the right breast lower outer quadrant.  This was marked.  She was then taken back to the operating room.  She was placed supine upon the OR table.  After induction of general esthesia, the right breast was prepped and draped in a sterile fashion timeout performed.  Proper patient, site and procedure were verified.  She received appropriate perioperative antibiotics.  Neoprobe used to identify the seed in the right lower outer quadrant.  Curvilinear incision was made over this area and all tissue around the seed and clip were excised with a grossly negative margin.  The cavities made hemostatic with cautery.  The Faxitron revealed the seed and clip to be in the specimen.  The wound was irrigated.   Hemostasis achieved with cautery and was closed with 3-0 Vicryl and 4 Monocryl.  Dermabond applied.  All counts found to be correct.  The patient was awoke extubated taken to recovery in satisfactory condition.

## 2020-07-20 NOTE — Transfer of Care (Signed)
Immediate Anesthesia Transfer of Care Note  Patient: Karen [name]  Procedure(s) Performed: RIGHT BREAST LUMPECTOMY WITH RADIOACTIVE SEED LOCALIZATION (Right Breast)  Patient Location: PACU  Anesthesia Type:General  Level of Consciousness: sedated  Airway & Oxygen Therapy: Patient Spontanous Breathing and Patient connected to face mask oxygen  Post-op Assessment: Report given to RN and Post -op Vital signs reviewed and stable  Post vital signs: Reviewed and stable  Last Vitals:  Vitals Value Taken Time  BP 95/60 07/20/20 0930  Temp    Pulse 72 07/20/20 0930  Resp 13 07/20/20 0930  SpO2 96 % 07/20/20 0930  Vitals shown include unvalidated device data.  Last Pain:  Vitals:   07/20/20 0744  TempSrc: Oral  PainSc: 0-No pain      Patients Stated Pain Goal: 4 (07/20/20 0744)  Complications: No complications documented.

## 2020-07-20 NOTE — Interval H&P Note (Signed)
History and Physical Interval Note:  07/20/2020 8:15 AM  Karen [name]  has presented today for surgery, with the diagnosis of RADIAL SCAR.  The various methods of treatment have been discussed with the patient and family. After consideration of risks, benefits and other options for treatment, the patient has consented to  Procedure(s): RIGHT BREAST LUMPECTOMY WITH RADIOACTIVE SEED LOCALIZATION (Right) as a surgical intervention.  The patient's history has been reviewed, patient examined, no change in status, stable for surgery.  I have reviewed the patient's chart and labs.  Questions were answered to the patient's satisfaction.     Dortha Schwalbe MD

## 2020-07-21 ENCOUNTER — Encounter (HOSPITAL_BASED_OUTPATIENT_CLINIC_OR_DEPARTMENT_OTHER): Payer: Self-pay | Admitting: Surgery

## 2020-07-21 LAB — SURGICAL PATHOLOGY

## 2020-10-04 MED FILL — LEVOTHYROXINE 88 MCG TABLET: 88 | 90 days supply | Qty: 90 | Fill #2

## 2020-12-01 ENCOUNTER — Other Ambulatory Visit: Payer: Self-pay | Admitting: Gastroenterology

## 2020-12-01 DIAGNOSIS — K219 Gastro-esophageal reflux disease without esophagitis: Secondary | ICD-10-CM

## 2020-12-02 ENCOUNTER — Other Ambulatory Visit: Payer: Self-pay | Admitting: Gastroenterology

## 2020-12-02 MED FILL — PANTOPRAZOLE SOD DR 40 MG T: 40 | 90 days supply | Qty: 90 | Fill #0

## 2021-01-02 MED FILL — LEVOTHYROXINE 88 MCG TABLET: 88 | 90 days supply | Qty: 90 | Fill #3

## 2021-01-03 ENCOUNTER — Other Ambulatory Visit (HOSPITAL_BASED_OUTPATIENT_CLINIC_OR_DEPARTMENT_OTHER): Payer: Self-pay

## 2021-01-19 ENCOUNTER — Other Ambulatory Visit (HOSPITAL_COMMUNITY): Payer: Self-pay

## 2021-03-17 ENCOUNTER — Other Ambulatory Visit (HOSPITAL_COMMUNITY): Payer: Self-pay

## 2021-03-17 MED FILL — Pantoprazole Sodium EC Tab 40 MG (Base Equiv): ORAL | 90 days supply | Qty: 90 | Fill #0 | Status: AC

## 2021-04-06 ENCOUNTER — Other Ambulatory Visit: Payer: Self-pay

## 2021-04-06 ENCOUNTER — Ambulatory Visit (INDEPENDENT_AMBULATORY_CARE_PROVIDER_SITE_OTHER): Payer: No Typology Code available for payment source | Admitting: Family Medicine

## 2021-04-06 ENCOUNTER — Encounter: Payer: Self-pay | Admitting: Family Medicine

## 2021-04-06 ENCOUNTER — Other Ambulatory Visit (HOSPITAL_COMMUNITY): Payer: Self-pay

## 2021-04-06 VITALS — BP 126/68 | HR 80 | Temp 98.5°F | Resp 14 | Ht 63.0 in | Wt 238.0 lb

## 2021-04-06 DIAGNOSIS — Z23 Encounter for immunization: Secondary | ICD-10-CM

## 2021-04-06 DIAGNOSIS — Z0001 Encounter for general adult medical examination with abnormal findings: Secondary | ICD-10-CM

## 2021-04-06 DIAGNOSIS — E038 Other specified hypothyroidism: Secondary | ICD-10-CM | POA: Diagnosis not present

## 2021-04-06 DIAGNOSIS — Z0000 Encounter for general adult medical examination without abnormal findings: Secondary | ICD-10-CM

## 2021-04-06 LAB — CBC WITH DIFFERENTIAL/PLATELET
Absolute Monocytes: 533 cells/uL (ref 200–950)
Basophils Absolute: 20 cells/uL (ref 0–200)
Basophils Relative: 0.3 %
Eosinophils Absolute: 169 cells/uL (ref 15–500)
Eosinophils Relative: 2.6 %
HCT: 45.5 % — ABNORMAL HIGH (ref 35.0–45.0)
Hemoglobin: 15.2 g/dL (ref 11.7–15.5)
Lymphs Abs: 2548 cells/uL (ref 850–3900)
MCH: 28.3 pg (ref 27.0–33.0)
MCHC: 33.4 g/dL (ref 32.0–36.0)
MCV: 84.6 fL (ref 80.0–100.0)
MPV: 10.2 fL (ref 7.5–12.5)
Monocytes Relative: 8.2 %
Neutro Abs: 3231 cells/uL (ref 1500–7800)
Neutrophils Relative %: 49.7 %
Platelets: 292 10*3/uL (ref 140–400)
RBC: 5.38 10*6/uL — ABNORMAL HIGH (ref 3.80–5.10)
RDW: 14.9 % (ref 11.0–15.0)
Total Lymphocyte: 39.2 %
WBC: 6.5 10*3/uL (ref 3.8–10.8)

## 2021-04-06 LAB — LIPID PANEL
Cholesterol: 213 mg/dL — ABNORMAL HIGH (ref <200)
HDL: 52 mg/dL (ref >=50)
LDL Cholesterol (Calc): 138 mg/dL (calc) — ABNORMAL HIGH
Non-HDL Cholesterol (Calc): 161 mg/dL (calc) — ABNORMAL HIGH (ref <130)
Total CHOL/HDL Ratio: 4.1 (calc) (ref <5.0)
Triglycerides: 116 mg/dL (ref <150)

## 2021-04-06 LAB — COMPLETE METABOLIC PANEL WITH GFR
AG Ratio: 1.6 (calc) (ref 1.0–2.5)
ALT: 11 U/L (ref 6–29)
AST: 11 U/L (ref 10–35)
Albumin: 4.2 g/dL (ref 3.6–5.1)
Alkaline phosphatase (APISO): 72 U/L (ref 31–125)
BUN: 8 mg/dL (ref 7–25)
CO2: 20 mmol/L (ref 20–32)
Calcium: 9.4 mg/dL (ref 8.6–10.2)
Chloride: 106 mmol/L (ref 98–110)
Creat: 0.7 mg/dL (ref 0.50–1.10)
GFR, Est African American: 121 mL/min/{1.73_m2} (ref >=60)
GFR, Est Non African American: 105 mL/min/{1.73_m2} (ref >=60)
Globulin: 2.7 g/dL (calc) (ref 1.9–3.7)
Glucose, Bld: 103 mg/dL — ABNORMAL HIGH (ref 65–99)
Potassium: 4.1 mmol/L (ref 3.5–5.3)
Sodium: 136 mmol/L (ref 135–146)
Total Bilirubin: 0.4 mg/dL (ref 0.2–1.2)
Total Protein: 6.9 g/dL (ref 6.1–8.1)

## 2021-04-06 LAB — TSH: TSH: 4.29 mIU/L

## 2021-04-06 MED ORDER — WEGOVY 0.5 MG/0.5ML ~~LOC~~ SOAJ
0.5000 mg | SUBCUTANEOUS | 0 refills | Status: DC
  Filled 2021-04-06: qty 2, fill #0

## 2021-04-06 NOTE — Progress Notes (Signed)
Subjective:    Patient ID: Karen [name], female    DOB: 1976-08-31, 45 y.o.   MRN: [id number]  Patient is here today for complete physical exam.  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  This was removed completely.  There was no residual cancer found on examination.  She is due for repeat mammogram after August.  She had a Pap smear performed last year that was normal.  She is due for repeat Pap smear in 2024.  She is due for a colonoscopy now but she defers this at the present time due to financial limitations.  She is also due for fasting lab work.  Her BMI is elevated.  She is interested in weight loss medication.  BMI is 42.  She has had history of borderline hypertension in the past we want to avoid phentermine.  She is sexually active on no anticontraceptives so I want to avoid Topamax due to potential birth defects.  We spent a large amount of time discussing Ozempic and she is interested in trying this medication.  She is working on diet and exercise as well  Past Medical History:  Diagnosis Date   Anxiety    Depression    GERD (gastroesophageal reflux disease)    Hypothyroidism    subclinical/borderline   Smoker 02/23/2013   Smoker    Thyroid disease    Phreesia 03/07/2020   Past Surgical History:  Procedure Laterality Date   BREAST LUMPECTOMY WITH RADIOACTIVE SEED LOCALIZATION Right 07/20/2020   Procedure: RIGHT BREAST LUMPECTOMY WITH RADIOACTIVE SEED LOCALIZATION;  Surgeon: Harriette Bouillon, MD;  Location: [facility];  Service: General;  Laterality: Right;   CHOLECYSTECTOMY N/A    age 30   CHOLECYSTECTOMY, LAPAROSCOPIC      Current Outpatient Medications on File Prior to Visit  Medication Sig Dispense Refill   aspirin EC 81 MG tablet Take 81 mg by mouth daily.     fish oil-omega-3 fatty acids 1000 MG capsule Take 1,000 mg by mouth daily.      levothyroxine (SYNTHROID) 88 MCG tablet TAKE 1 TABLET BY MOUTH DAILY 90 tablet  3   pantoprazole (PROTONIX) 40 MG tablet TAKE 1 TABLET BY MOUTH DAILY BEFORE BREAKFAST. 90 tablet 3   No current facility-administered medications on file prior to visit.   Allergies  Allergen Reactions   Penicillins Hives   Social History   Socioeconomic History   Marital status: Married    Spouse name: Not on file   Number of children: Not on file   Years of education: Not on file   Highest education level: Not on file  Occupational History   Not on file  Tobacco Use   Smoking status: Former    Pack years: 0.00    Types: Cigarettes    Quit date: 2017    Years since quitting: 5.4   Smokeless tobacco: Former    Quit date: 03/25/2016  Substance and Sexual Activity   Alcohol use: Not Currently    Alcohol/week: 0.0 standard drinks    Comment: Rare-maybe once a year   Drug use: No   Sexual activity: Yes  Other Topics Concern   Not on file  Social History Narrative   Not on file   Social Determinants of Health   Financial Resource Strain: Not on file  Food Insecurity: Not on file  Transportation Needs: Not on file  Physical Activity: Not on file  Stress: Not on file  Social  Connections: Not on file  Intimate Partner Violence: Not on file   Family History  Problem Relation Age of Onset   Diabetes Mother    Hyperlipidemia Mother    Hypertension Mother    Hypertension Father    Colon cancer Neg Hx      Review of Systems     Objective:   Physical Exam Constitutional:      General: She is not in acute distress.    Appearance: She is well-developed. She is not diaphoretic.  HENT:     Head: Normocephalic and atraumatic.     Right Ear: External ear normal.     Left Ear: External ear normal.     Nose: Nose normal.     Mouth/Throat:     Pharynx: No oropharyngeal exudate.  Eyes:     General: No scleral icterus.       Right eye: No discharge.        Left eye: No discharge.     Conjunctiva/sclera: Conjunctivae normal.     Pupils: Pupils are equal, round, and  reactive to light.  Neck:     Thyroid: No thyromegaly.     Vascular: No JVD.     Trachea: No tracheal deviation.  Cardiovascular:     Rate and Rhythm: Normal rate and regular rhythm.     Heart sounds: Normal heart sounds. No murmur heard.   No friction rub. No gallop.  Pulmonary:     Effort: Pulmonary effort is normal. No respiratory distress.     Breath sounds: Normal breath sounds. No stridor. No wheezing or rales.  Chest:     Chest wall: No tenderness.  Abdominal:     General: Bowel sounds are normal. There is no distension.     Palpations: Abdomen is soft.     Tenderness: There is no abdominal tenderness. There is no guarding or rebound.  Musculoskeletal:        General: No tenderness or deformity. Normal range of motion.     Cervical back: Normal range of motion and neck supple.  Lymphadenopathy:     Cervical: No cervical adenopathy.  Skin:    General: Skin is warm.     Coloration: Skin is not pale.  Neurological:     Mental Status: She is alert and oriented to person, place, and time.     Cranial Nerves: No cranial nerve deficit.     Motor: No abnormal muscle tone.     Coordination: Coordination normal.     Deep Tendon Reflexes: Reflexes are normal and symmetric.  Psychiatric:        Behavior: Behavior normal.        Thought Content: Thought content normal.        Judgment: Judgment normal.          Assessment & Plan:  Other specified hypothyroidism - Plan: CBC with Differential/Platelet, COMPLETE METABOLIC PANEL WITH GFR, Lipid panel, TSH  General medical exam - Plan: CBC with Differential/Platelet, COMPLETE METABOLIC PANEL WITH GFR, Lipid panel, TSH  Need for tetanus, diphtheria, and acellular pertussis (Tdap) vaccine in patient of adolescent age or older Patient received her tetanus shot today.  I recommended a mammogram in September which she prefers to schedule.  Colonoscopy is due but she defers this at the present time and she will notify me when she is  ready for me to schedule this.  Pap smear is up-to-date.  Check CBC CMP and a lipid panel.  Given her history of hypothyroidism also  check a TSH.  We discussed weight loss options given her BMI of 42 and she would like to try Ozempic 0.5 mg subcu weekly.  Recheck in 1 month and if tolerating the medication we will increase to 1 mg

## 2021-04-10 ENCOUNTER — Other Ambulatory Visit: Payer: Self-pay | Admitting: Family Medicine

## 2021-04-10 ENCOUNTER — Other Ambulatory Visit (HOSPITAL_COMMUNITY): Payer: Self-pay

## 2021-04-10 MED ORDER — OZEMPIC (0.25 OR 0.5 MG/DOSE) 2 MG/1.5ML ~~LOC~~ SOPN
0.5000 mg | PEN_INJECTOR | SUBCUTANEOUS | 3 refills | Status: DC
  Filled 2021-04-10: qty 1.5, 28d supply, fill #0

## 2021-04-11 ENCOUNTER — Other Ambulatory Visit (HOSPITAL_COMMUNITY): Payer: Self-pay

## 2021-04-14 ENCOUNTER — Other Ambulatory Visit (HOSPITAL_COMMUNITY): Payer: Self-pay

## 2021-04-18 ENCOUNTER — Other Ambulatory Visit (HOSPITAL_COMMUNITY): Payer: Self-pay

## 2021-04-18 ENCOUNTER — Other Ambulatory Visit: Payer: Self-pay | Admitting: Family Medicine

## 2021-04-18 MED ORDER — LEVOTHYROXINE SODIUM 88 MCG PO TABS
ORAL_TABLET | Freq: Every day | ORAL | 3 refills | Status: DC
  Filled 2021-04-18: qty 90, 90d supply, fill #0
  Filled 2021-08-08: qty 90, 90d supply, fill #1
  Filled 2021-11-29: qty 90, 90d supply, fill #2
  Filled 2022-04-18: qty 90, 90d supply, fill #3

## 2021-04-19 ENCOUNTER — Other Ambulatory Visit (HOSPITAL_COMMUNITY): Payer: Self-pay

## 2021-05-01 ENCOUNTER — Encounter: Payer: Self-pay | Admitting: Family Medicine

## 2021-05-09 ENCOUNTER — Ambulatory Visit: Payer: No Typology Code available for payment source | Admitting: Family Medicine

## 2021-05-11 ENCOUNTER — Other Ambulatory Visit (HOSPITAL_COMMUNITY): Payer: Self-pay

## 2021-05-11 ENCOUNTER — Encounter: Payer: Self-pay | Admitting: Family Medicine

## 2021-05-11 MED ORDER — SEMAGLUTIDE (1 MG/DOSE) 4 MG/3ML ~~LOC~~ SOPN
1.0000 mg | PEN_INJECTOR | SUBCUTANEOUS | 3 refills | Status: DC
  Filled 2021-05-11: qty 3, 28d supply, fill #0
  Filled 2021-06-12: qty 3, 28d supply, fill #1
  Filled 2021-07-11: qty 3, 28d supply, fill #2
  Filled 2021-08-08: qty 3, 28d supply, fill #3

## 2021-06-12 ENCOUNTER — Other Ambulatory Visit (HOSPITAL_COMMUNITY): Payer: Self-pay

## 2021-06-15 ENCOUNTER — Other Ambulatory Visit (HOSPITAL_COMMUNITY): Payer: Self-pay | Admitting: Family Medicine

## 2021-06-15 ENCOUNTER — Ambulatory Visit (HOSPITAL_COMMUNITY)
Admission: RE | Admit: 2021-06-15 | Discharge: 2021-06-15 | Disposition: A | Discharge status: Home / Self Care | Payer: No Typology Code available for payment source | Source: Ambulatory Visit | Attending: Family Medicine | Admitting: Family Medicine

## 2021-06-15 ENCOUNTER — Other Ambulatory Visit: Payer: Self-pay

## 2021-06-15 DIAGNOSIS — Z1231 Encounter for screening mammogram for malignant neoplasm of breast: Secondary | ICD-10-CM

## 2021-07-11 ENCOUNTER — Other Ambulatory Visit (HOSPITAL_COMMUNITY): Payer: Self-pay

## 2021-07-13 ENCOUNTER — Other Ambulatory Visit (HOSPITAL_COMMUNITY): Payer: Self-pay

## 2021-08-01 ENCOUNTER — Other Ambulatory Visit (HOSPITAL_COMMUNITY): Payer: Self-pay

## 2021-08-01 ENCOUNTER — Ambulatory Visit (INDEPENDENT_AMBULATORY_CARE_PROVIDER_SITE_OTHER): Payer: No Typology Code available for payment source | Admitting: Family Medicine

## 2021-08-01 ENCOUNTER — Encounter: Payer: Self-pay | Admitting: Family Medicine

## 2021-08-01 ENCOUNTER — Other Ambulatory Visit: Payer: Self-pay

## 2021-08-01 VITALS — BP 118/76 | HR 78 | Temp 98.3°F | Ht 63.0 in | Wt 213.0 lb

## 2021-08-01 DIAGNOSIS — R053 Chronic cough: Secondary | ICD-10-CM

## 2021-08-01 MED ORDER — CEFDINIR 300 MG PO CAPS
300.0000 mg | ORAL_CAPSULE | Freq: Two times a day (BID) | ORAL | 0 refills | Status: DC
  Filled 2021-08-01: qty 20, 10d supply, fill #0

## 2021-08-01 MED ORDER — FLUTICASONE PROPIONATE 50 MCG/ACT NA SUSP
2.0000 | Freq: Every day | NASAL | 6 refills | Status: DC
  Filled 2021-08-01: qty 16, 30d supply, fill #0

## 2021-08-01 NOTE — Progress Notes (Signed)
Subjective:    Patient ID: Karen [name], female    DOB: [birth year], 45 y.o.   MRN: [id number]  HPI  Patient states that she has had a chronic cough since May.  She had the flu in May.  After the flu went away, she has had a cough ever since.  She states that she feels like she has drainage going down her throat triggering the cough.  The cough compared to be productive of yellow or white sputum.  She reports head congestion.  She also reports sinus pressure.  She has tried numerous allergy medications including Claritin and decongestants including Sudafed without relief.  She denies any fevers or chills.  She denies any chest pain or shortness of breath.  She denies any pleurisy.  She denies any wheezing.  She denies any hemoptysis.  She denies any night sweats. Past Medical History:  Diagnosis Date   Anxiety    Depression    GERD (gastroesophageal reflux disease)    Hypothyroidism    subclinical/borderline   Smoker 02/23/2013   Smoker    Thyroid disease    Phreesia 03/07/2020   Past Surgical History:  Procedure Laterality Date   BREAST LUMPECTOMY WITH RADIOACTIVE SEED LOCALIZATION Right 07/20/2020   Procedure: RIGHT BREAST LUMPECTOMY WITH RADIOACTIVE SEED LOCALIZATION;  Surgeon: Harriette Bouillon, MD;  Location: [facility];  Service: General;  Laterality: Right;   CHOLECYSTECTOMY N/A    age 8   CHOLECYSTECTOMY, LAPAROSCOPIC      Current Outpatient Medications on File Prior to Visit  Medication Sig Dispense Refill   aspirin EC 81 MG tablet Take 81 mg by mouth daily.     fish oil-omega-3 fatty acids 1000 MG capsule Take 1,000 mg by mouth daily.      levothyroxine (SYNTHROID) 88 MCG tablet Take one tablet by mouth daily. 90 tablet 3   pantoprazole (PROTONIX) 40 MG tablet TAKE 1 TABLET BY MOUTH DAILY BEFORE BREAKFAST. 90 tablet 3   Semaglutide, 1 MG/DOSE, 4 MG/3ML SOPN Inject 1 mg as directed once a week. 3 mL 3   No current facility-administered medications on file  prior to visit.   Allergies  Allergen Reactions   Penicillins Hives   Social History   Socioeconomic History   Marital status: Married    Spouse name: Not on file   Number of children: Not on file   Years of education: Not on file   Highest education level: Not on file  Occupational History   Not on file  Tobacco Use   Smoking status: Former    Types: Cigarettes    Quit date: 2017    Years since quitting: 5.7   Smokeless tobacco: Former    Quit date: 03/25/2016  Substance and Sexual Activity   Alcohol use: Not Currently    Alcohol/week: 0.0 standard drinks    Comment: Rare-maybe once a year   Drug use: No   Sexual activity: Yes  Other Topics Concern   Not on file  Social History Narrative   Not on file   Social Determinants of Health   Financial Resource Strain: Not on file  Food Insecurity: Not on file  Transportation Needs: Not on file  Physical Activity: Not on file  Stress: Not on file  Social Connections: Not on file  Intimate Partner Violence: Not on file   Family History  Problem Relation Age of Onset   Diabetes Mother    Hyperlipidemia Mother    Hypertension Mother  Hypertension Father    Colon cancer Neg Hx      Review of Systems  All other systems reviewed and are negative.     Objective:   Physical Exam Constitutional:      General: She is not in acute distress.    Appearance: She is well-developed. She is not diaphoretic.  HENT:     Right Ear: Tympanic membrane and ear canal normal.     Left Ear: Tympanic membrane and ear canal normal.     Nose: Congestion present.     Right Turbinates: Enlarged and swollen.     Left Turbinates: Enlarged and swollen.     Mouth/Throat:     Pharynx: Oropharynx is clear. No oropharyngeal exudate.  Eyes:     Conjunctiva/sclera: Conjunctivae normal.  Neck:     Thyroid: No thyromegaly.     Vascular: No JVD.     Trachea: No tracheal deviation.  Cardiovascular:     Rate and Rhythm: Normal rate and  regular rhythm.     Heart sounds: Normal heart sounds. No murmur heard.   No friction rub. No gallop.  Pulmonary:     Effort: Pulmonary effort is normal. No respiratory distress.     Breath sounds: Normal breath sounds. No stridor. No wheezing, rhonchi or rales.  Chest:     Chest wall: No tenderness.  Musculoskeletal:     Cervical back: Neck supple.  Lymphadenopathy:     Cervical: No cervical adenopathy.  Psychiatric:        Behavior: Behavior normal.        Thought Content: Thought content normal.        Judgment: Judgment normal.          Assessment & Plan:    Chronic cough - Plan: DG Chest 2 View I believe the patient may have a chronic cough due to underlying upper airway cough syndrome due to postnasal drip and chronic sinusitis after her flu.  Recommended trying Omnicef 300 mg twice daily for 10 days and Flonase 2 sprays each nostril daily along with obtaining a chest x-ray to rule out any underlying pulmonary pathology.  Recheck in 2 weeks if no better or sooner if worsening

## 2021-08-08 ENCOUNTER — Other Ambulatory Visit (HOSPITAL_COMMUNITY): Payer: Self-pay

## 2021-08-08 MED FILL — Pantoprazole Sodium EC Tab 40 MG (Base Equiv): ORAL | 90 days supply | Qty: 90 | Fill #1 | Status: AC

## 2021-09-04 ENCOUNTER — Other Ambulatory Visit: Payer: Self-pay | Admitting: Family Medicine

## 2021-09-04 ENCOUNTER — Other Ambulatory Visit (HOSPITAL_COMMUNITY): Payer: Self-pay

## 2021-09-04 MED ORDER — OZEMPIC (1 MG/DOSE) 4 MG/3ML ~~LOC~~ SOPN
1.0000 mg | PEN_INJECTOR | SUBCUTANEOUS | 3 refills | Status: DC
  Filled 2021-09-04: qty 3, 28d supply, fill #0
  Filled 2021-10-02: qty 3, 28d supply, fill #1
  Filled 2021-11-03: qty 3, 28d supply, fill #2

## 2021-10-02 ENCOUNTER — Other Ambulatory Visit (HOSPITAL_COMMUNITY): Payer: Self-pay

## 2021-11-03 ENCOUNTER — Other Ambulatory Visit (HOSPITAL_COMMUNITY): Payer: Self-pay

## 2021-11-06 ENCOUNTER — Other Ambulatory Visit (HOSPITAL_COMMUNITY): Payer: Self-pay

## 2021-11-11 IMAGING — MG MM DIGITAL SCREENING BILAT W/ TOMO AND CAD
6 of 12 series · 6 of 36 positions shown · non-contrast
Comparison: Previous exam(s).

CLINICAL DATA: Screening.

EXAM:
DIGITAL SCREENING BILATERAL MAMMOGRAM WITH TOMOSYNTHESIS AND CAD
TECHNIQUE: Bilateral screening digital craniocaudal and mediolateral oblique
mammograms were obtained. Bilateral screening digital breast
tomosynthesis was performed. The images were evaluated with
computer-aided detection.

[L MLO synth-2D]
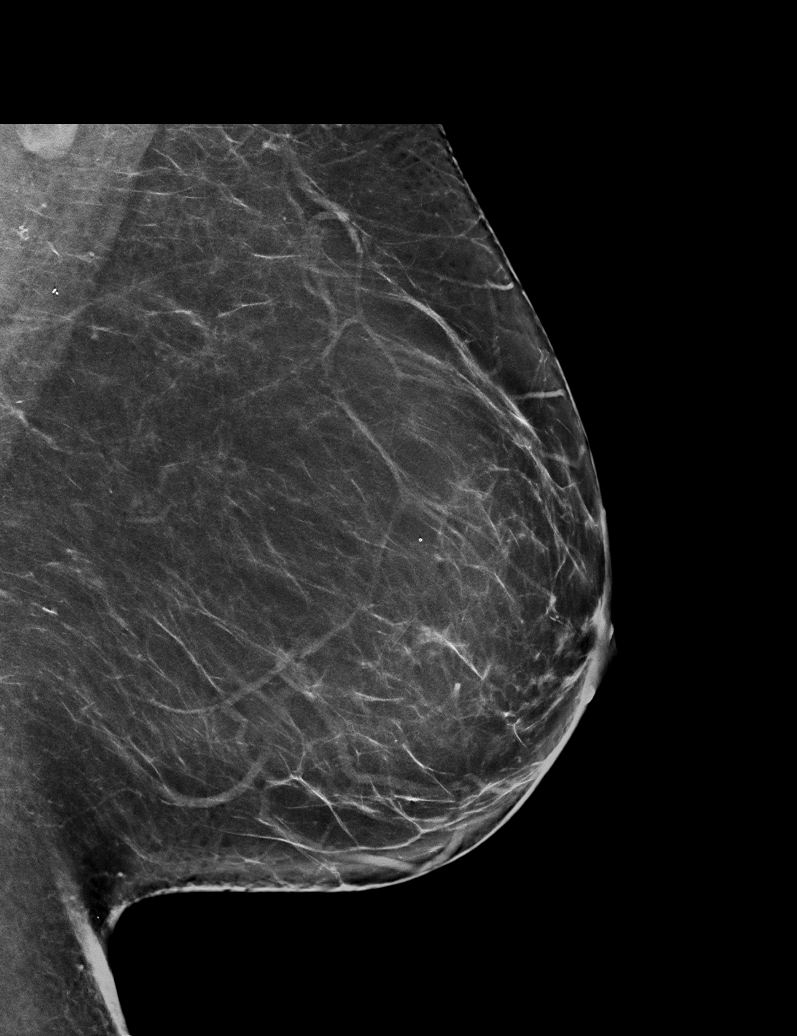

[L CC synth-2D (1 of 2)]
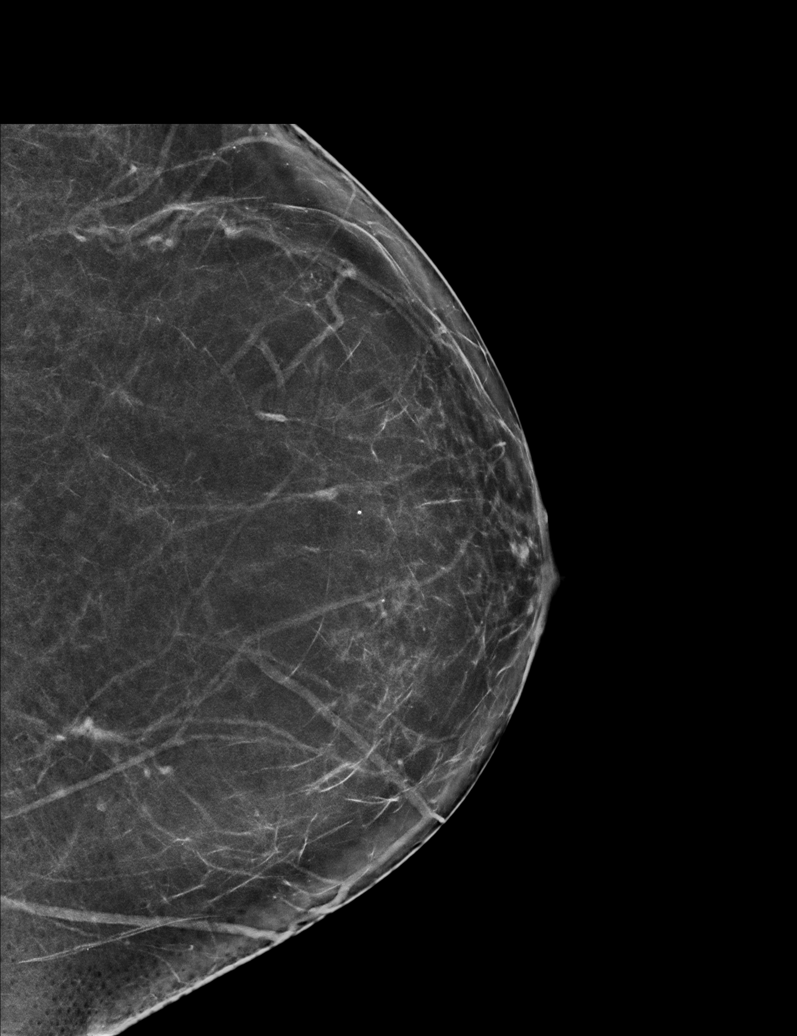

[L CC synth-2D (2 of 2)]
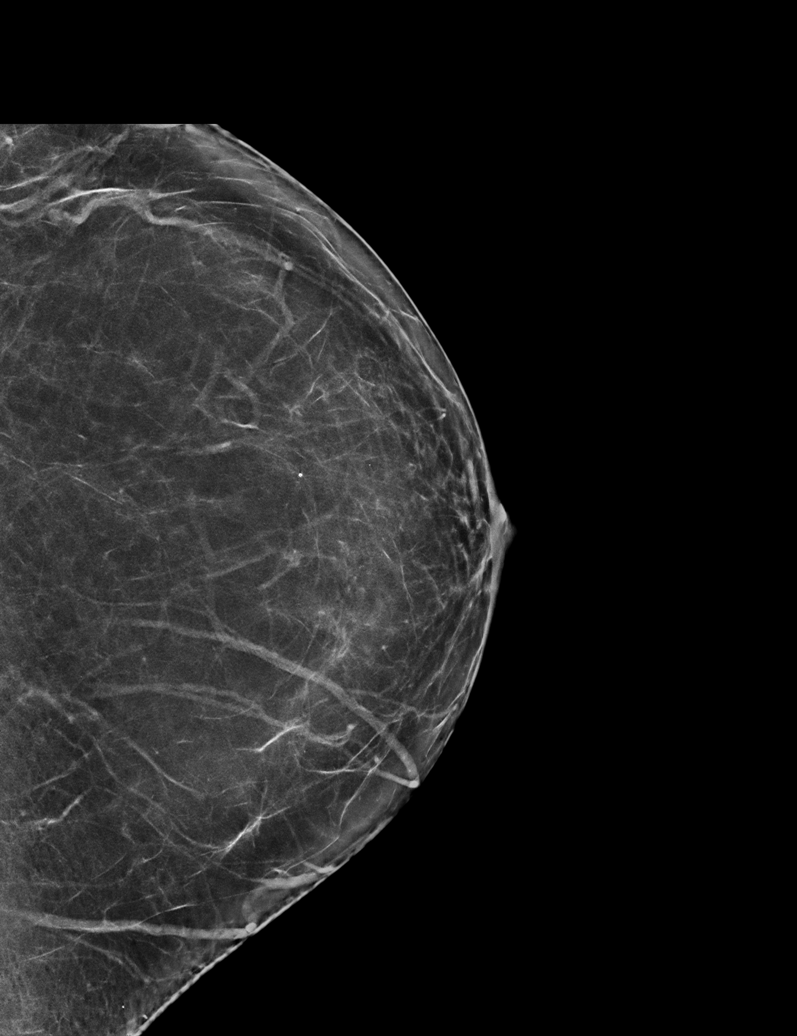

[R CC synth-2D (1 of 2)]
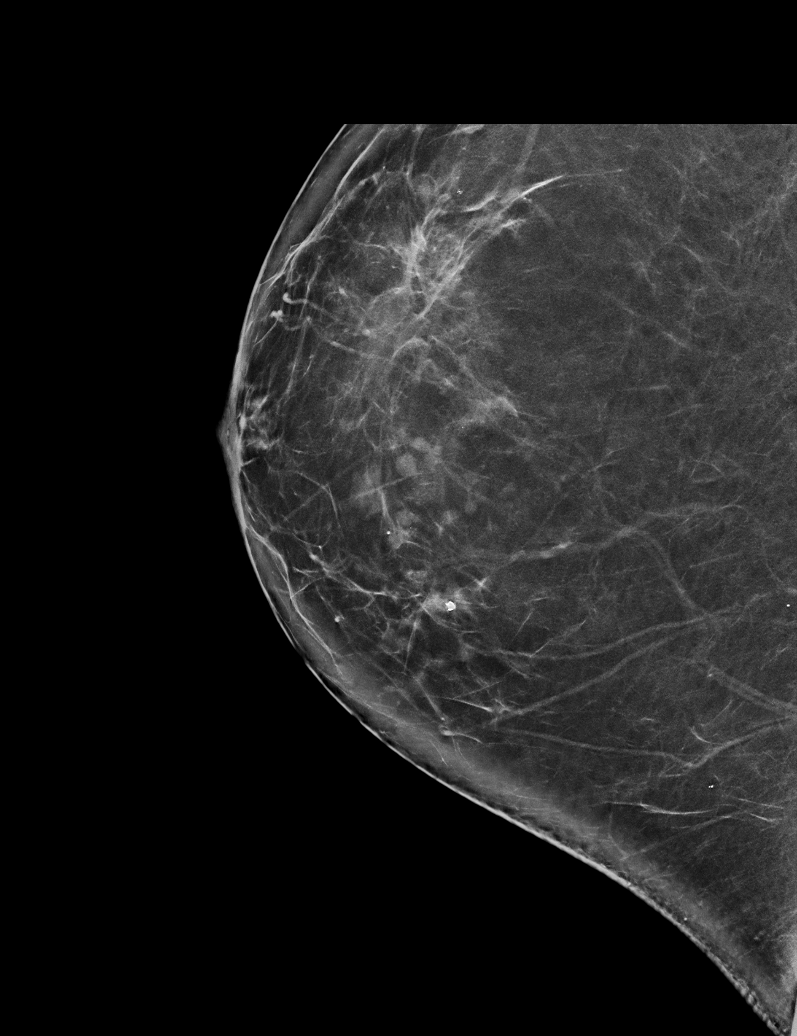

[R MLO synth-2D]
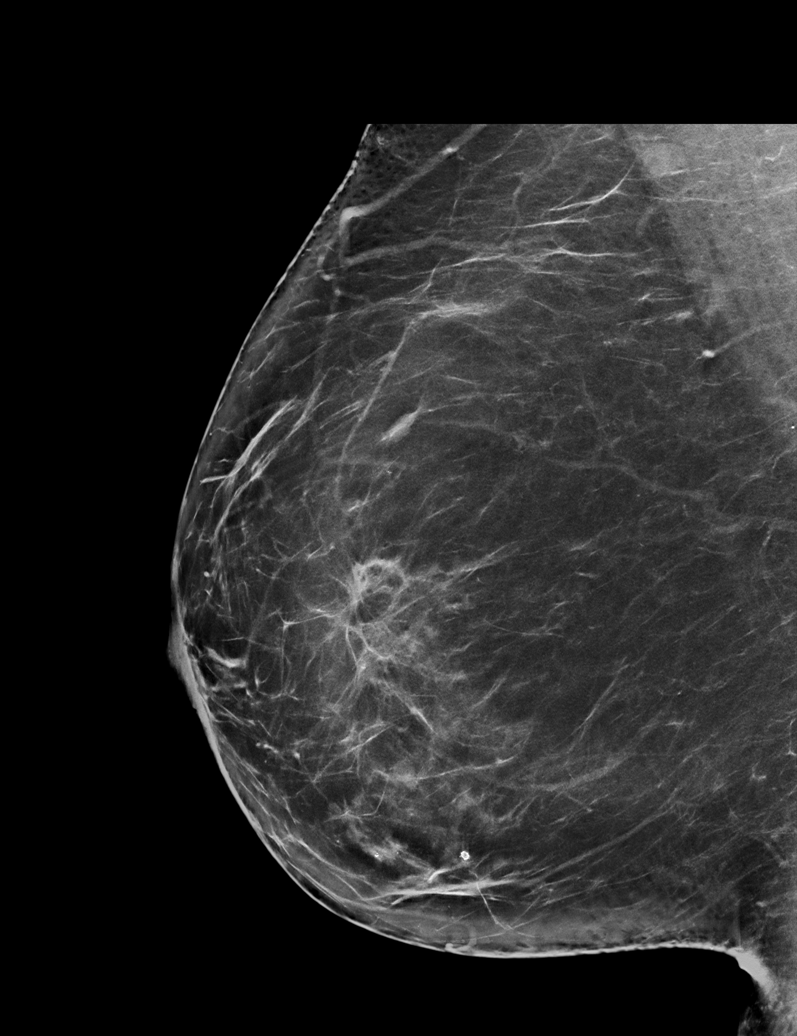

[R CC synth-2D (2 of 2)]
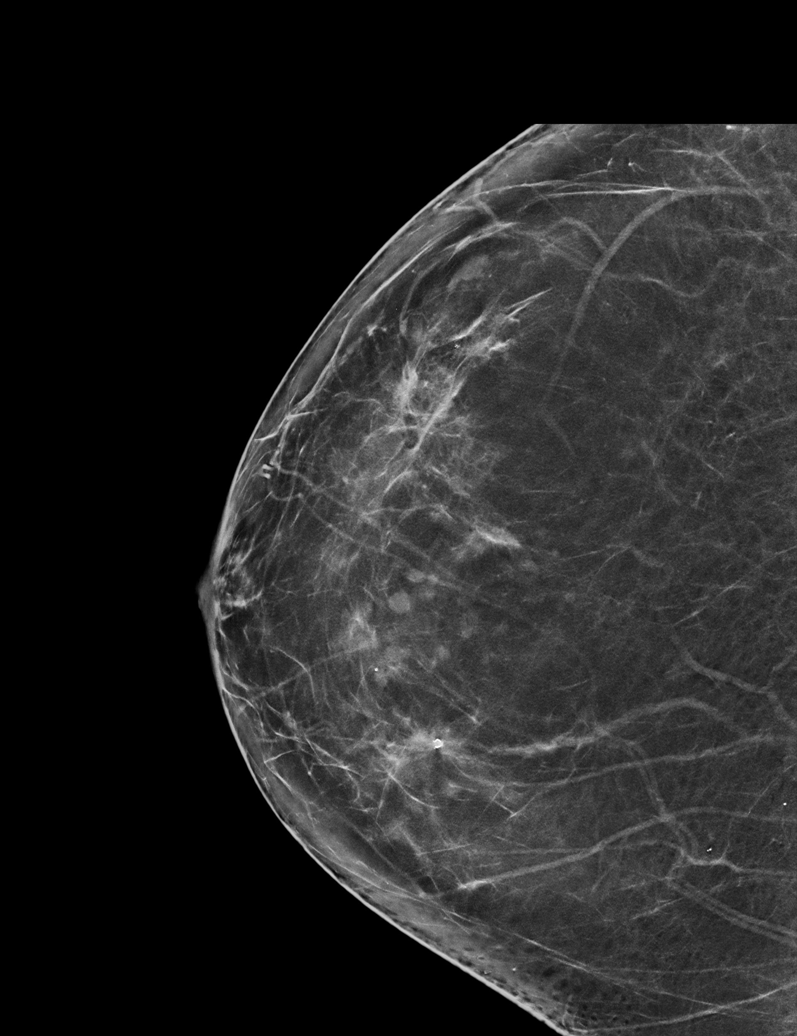

[6 of 36 positions shown; findings below may reference images not displayed]

ACR Breast Density Category b: There are scattered areas of
fibroglandular density.
FINDINGS: There are no findings suspicious for malignancy.
IMPRESSION: No mammographic evidence of malignancy. A result letter of this
screening mammogram will be mailed directly to the patient.

RECOMMENDATION:
Screening mammogram in one year. (Code:51-O-LD2)

BI-RADS CATEGORY  1: Negative.

## 2021-11-13 ENCOUNTER — Ambulatory Visit (INDEPENDENT_AMBULATORY_CARE_PROVIDER_SITE_OTHER): Payer: No Typology Code available for payment source | Admitting: Family Medicine

## 2021-11-13 ENCOUNTER — Encounter: Payer: Self-pay | Admitting: Family Medicine

## 2021-11-13 ENCOUNTER — Other Ambulatory Visit (HOSPITAL_COMMUNITY): Payer: Self-pay

## 2021-11-13 ENCOUNTER — Other Ambulatory Visit: Payer: Self-pay

## 2021-11-13 VITALS — BP 138/82 | HR 65 | Temp 97.6°F | Resp 18 | Ht 63.0 in | Wt 213.0 lb

## 2021-11-13 DIAGNOSIS — R7303 Prediabetes: Secondary | ICD-10-CM | POA: Diagnosis not present

## 2021-11-13 DIAGNOSIS — E038 Other specified hypothyroidism: Secondary | ICD-10-CM | POA: Diagnosis not present

## 2021-11-13 MED ORDER — WEGOVY 1.7 MG/0.75ML ~~LOC~~ SOAJ
1.7000 mg | SUBCUTANEOUS | 3 refills | Status: DC
  Filled 2021-11-13 – 2021-11-23 (×2): qty 3, 28d supply, fill #0

## 2021-11-13 NOTE — Progress Notes (Signed)
Subjective:    Patient ID: Karen [name], female    DOB: [birth year], 46 y.o.   MRN: [id number]  Patient is a very pleasant 46 year old Caucasian female here today for a follow-up.  She has a history of hypothyroidism currently on levothyroxine as well as prediabetes currently on Ozempic.  She is on 1 mg subcu weekly of Ozempic.  She witnessed substantial weight loss initially dropping from 138 pounds to 213 pounds.  However her weight loss is plateaued at 213 pounds.  She denies any nausea or side effects on the Ozempic and she would be willing to go to a higher dose if it would help facilitate additional weight loss.  Specifically she denies any chest pain shortness of breath or dyspnea on exertion.  Her blood pressure today is adequately controlled at 138/82.  Her mammogram is up-to-date.  Her Pap smear was performed in 2021.  However she is due for colonoscopy.  We discussed this today and she would like to defer that until next year.  Past Medical History:  Diagnosis Date   Anxiety    Depression    GERD (gastroesophageal reflux disease)    Hypothyroidism    subclinical/borderline   Smoker 02/23/2013   Smoker    Thyroid disease    Phreesia 03/07/2020   Past Surgical History:  Procedure Laterality Date   BREAST LUMPECTOMY WITH RADIOACTIVE SEED LOCALIZATION Right 07/20/2020   Procedure: RIGHT BREAST LUMPECTOMY WITH RADIOACTIVE SEED LOCALIZATION;  Surgeon: Harriette Bouillon, MD;  Location: [facility];  Service: General;  Laterality: Right;   CHOLECYSTECTOMY N/A    age 26   CHOLECYSTECTOMY, LAPAROSCOPIC      Current Outpatient Medications on File Prior to Visit  Medication Sig Dispense Refill   aspirin EC 81 MG tablet Take 81 mg by mouth daily.     fish oil-omega-3 fatty acids 1000 MG capsule Take 1,000 mg by mouth daily.      levothyroxine (SYNTHROID) 88 MCG tablet Take one tablet by mouth daily. 90 tablet 3   pantoprazole (PROTONIX) 40 MG tablet TAKE 1 TABLET BY MOUTH  DAILY BEFORE BREAKFAST. 90 tablet 3   Semaglutide, 1 MG/DOSE, (OZEMPIC, 1 MG/DOSE,) 4 MG/3ML SOPN Inject 1 mg into the skin once weekly as directed 3 mL 3   No current facility-administered medications on file prior to visit.   Allergies  Allergen Reactions   Penicillins Hives   Social History   Socioeconomic History   Marital status: Married    Spouse name: Not on file   Number of children: Not on file   Years of education: Not on file   Highest education level: Not on file  Occupational History   Not on file  Tobacco Use   Smoking status: Former    Types: Cigarettes    Quit date: 2017    Years since quitting: 6.0   Smokeless tobacco: Former    Quit date: 03/25/2016  Substance and Sexual Activity   Alcohol use: Not Currently    Alcohol/week: 0.0 standard drinks    Comment: Rare-maybe once a year   Drug use: No   Sexual activity: Yes  Other Topics Concern   Not on file  Social History Narrative   Not on file   Social Determinants of Health   Financial Resource Strain: Not on file  Food Insecurity: Not on file  Transportation Needs: Not on file  Physical Activity: Not on file  Stress: Not on file  Social Connections: Not on file  Intimate Partner Violence: Not on file   Family History  Problem Relation Age of Onset   Diabetes Mother    Hyperlipidemia Mother    Hypertension Mother    Hypertension Father    Colon cancer Neg Hx      Review of Systems     Objective:   Physical Exam Constitutional:      General: She is not in acute distress.    Appearance: She is well-developed. She is not diaphoretic.  HENT:     Head: Normocephalic and atraumatic.     Right Ear: External ear normal.     Left Ear: External ear normal.     Nose: Nose normal.     Mouth/Throat:     Pharynx: No oropharyngeal exudate.  Eyes:     General: No scleral icterus.       Right eye: No discharge.        Left eye: No discharge.     Conjunctiva/sclera: Conjunctivae normal.      Pupils: Pupils are equal, round, and reactive to light.  Neck:     Thyroid: No thyromegaly.     Vascular: No JVD.     Trachea: No tracheal deviation.  Cardiovascular:     Rate and Rhythm: Normal rate and regular rhythm.     Heart sounds: Normal heart sounds. No murmur heard.   No friction rub. No gallop.  Pulmonary:     Effort: Pulmonary effort is normal. No respiratory distress.     Breath sounds: Normal breath sounds. No stridor. No wheezing or rales.  Chest:     Chest wall: No tenderness.  Abdominal:     General: Bowel sounds are normal. There is no distension.     Palpations: Abdomen is soft.     Tenderness: There is no abdominal tenderness. There is no guarding or rebound.  Musculoskeletal:        General: No tenderness or deformity. Normal range of motion.     Cervical back: Normal range of motion and neck supple.  Lymphadenopathy:     Cervical: No cervical adenopathy.  Skin:    General: Skin is warm.     Coloration: Skin is not pale.  Neurological:     Mental Status: She is alert and oriented to person, place, and time.     Cranial Nerves: No cranial nerve deficit.     Motor: No abnormal muscle tone.     Coordination: Coordination normal.     Deep Tendon Reflexes: Reflexes are normal and symmetric.  Psychiatric:        Behavior: Behavior normal.        Thought Content: Thought content normal.        Judgment: Judgment normal.          Assessment & Plan:  Prediabetes - Plan: Hemoglobin A1c, CBC with Differential/Platelet, Lipid panel, BASIC METABOLIC PANEL WITH GFR  Other specified hypothyroidism - Plan: TSH  Morbid obesity (HCC) Patient has dropped her BMI from 42-37.  I am very proud of her for that.  However I would like to try to push her to a higher dose so we will switch to [facility] 1.7 mg subcu weekly.  If she tolerates this we will increase to 2.4 mg subcu weekly.  Meanwhile I will check a hemoglobin A1c today.  Goal A1c is less than 6.5.  I will also check  a fasting lipid panel.  Goal LDL cholesterol is less than 100 and I will monitor a TSH to  ensure adequate dosage of the levothyroxine.  Recommended a colonoscopy and she can call me back whenever she is ready for me to schedule this.

## 2021-11-14 ENCOUNTER — Other Ambulatory Visit: Payer: Self-pay

## 2021-11-14 LAB — CBC WITH DIFFERENTIAL/PLATELET
Absolute Monocytes: 552 cells/uL (ref 200–950)
Basophils Absolute: 41 cells/uL (ref 0–200)
Basophils Relative: 0.6 %
Eosinophils Absolute: 200 cells/uL (ref 15–500)
Eosinophils Relative: 2.9 %
HCT: 44.1 % (ref 35.0–45.0)
Hemoglobin: 14.6 g/dL (ref 11.7–15.5)
Lymphs Abs: 2684 cells/uL (ref 850–3900)
MCH: 28.5 pg (ref 27.0–33.0)
MCHC: 33.1 g/dL (ref 32.0–36.0)
MCV: 86.1 fL (ref 80.0–100.0)
MPV: 10.3 fL (ref 7.5–12.5)
Monocytes Relative: 8 %
Neutro Abs: 3422 cells/uL (ref 1500–7800)
Neutrophils Relative %: 49.6 %
Platelets: 323 10*3/uL (ref 140–400)
RBC: 5.12 10*6/uL — ABNORMAL HIGH (ref 3.80–5.10)
RDW: 13.6 % (ref 11.0–15.0)
Total Lymphocyte: 38.9 %
WBC: 6.9 10*3/uL (ref 3.8–10.8)

## 2021-11-14 LAB — LIPID PANEL
Cholesterol: 206 mg/dL — ABNORMAL HIGH (ref <200)
HDL: 48 mg/dL — ABNORMAL LOW (ref >=50)
LDL Cholesterol (Calc): 138 mg/dL (calc) — ABNORMAL HIGH
Non-HDL Cholesterol (Calc): 158 mg/dL (calc) — ABNORMAL HIGH (ref <130)
Total CHOL/HDL Ratio: 4.3 (calc) (ref <5.0)
Triglycerides: 95 mg/dL (ref <150)

## 2021-11-14 LAB — BASIC METABOLIC PANEL WITH GFR
BUN: 8 mg/dL (ref 7–25)
CO2: 27 mmol/L (ref 20–32)
Calcium: 9.1 mg/dL (ref 8.6–10.2)
Chloride: 107 mmol/L (ref 98–110)
Creat: 0.72 mg/dL (ref 0.50–0.99)
Glucose, Bld: 82 mg/dL (ref 65–99)
Potassium: 4.1 mmol/L (ref 3.5–5.3)
Sodium: 139 mmol/L (ref 135–146)
eGFR: 105 mL/min/{1.73_m2} (ref >=60)

## 2021-11-14 LAB — HEMOGLOBIN A1C
Hgb A1c MFr Bld: 5.2 % of total Hgb (ref <5.7)
Mean Plasma Glucose: 103 mg/dL
eAG (mmol/L): 5.7 mmol/L

## 2021-11-14 LAB — TSH: TSH: 1.4 mIU/L

## 2021-11-16 ENCOUNTER — Telehealth: Payer: Self-pay

## 2021-11-16 DIAGNOSIS — E669 Obesity, unspecified: Secondary | ICD-10-CM

## 2021-11-16 NOTE — Progress Notes (Signed)
(  Key: [id number])  [employer] is reviewing your PA request. You may close this dialog, return to your dashboard, and perform other tasks.  To check for an update later, open this request again from your dashboard. If [employer] has not replied within 24 hours for urgent requests or within 48 hours for standard requests, please contact [employer] at [phone].

## 2021-11-16 NOTE — Telephone Encounter (Signed)
PA form received for [facility]. Form completed and faxed back by front office.

## 2021-11-21 ENCOUNTER — Other Ambulatory Visit (HOSPITAL_COMMUNITY): Payer: Self-pay

## 2021-11-21 NOTE — Progress Notes (Signed)
Your prior authorization request has been denied. COMPLETE E-APPEAL Your request for prior authorization was denied, but an Electronic Appeal is available for your patient. Complete the questions in the Appeal section at the bottom of this page to pursue the appeal. For assistance, contact our support team at [phone].  Message from plan: This request has not been approved. Based on the information submitted for review, you did not meet our guideline rules for the requested drug. In order for your request to be approved, your provider would need to show that you have met the guideline rules below. The details below are written in medical language. If you have questions, please contact your provider. In some cases, the requested medication or alternatives offered may have additional approval requirements. For approval of Wegovy pens for adult patients needing weight loss or weight management, our guideline named ANTI-OBESITY AGENTS (reviewed for [facility]) requires the following rules to be met for approval: 1) Your provider has submitted evidence, such as chart notes, that you are actively enrolled in an exercise and caloric reduction program or a weight loss/behavioral modification program. 2) You are NOT currently taking another GLP-1 receptor agonist (type of drug, such as Ozempic, Victoza, Byetta, Bydureon, and Tanzeum). 3) You have ONE of the following: a) You have a Body mass index (BMI, a measure of body fat based on height and weight) of 30 kg/m(2) or greater. b) You have a BMI of 27 kg/m(2) or greater AND at least one weight-related comorbidity, such as high blood pressure, type 2 diabetes mellitus (a chronic condition in which your body is resistant to insulin, a hormone that regulates your blood sugar), or high cholesterol. This request was denied because we did not receive information that you meet the requirements listed above. Please work with your doctor to use a different medication or get [place]  more information if it will allow [place] to approve this request. A written notification letter will follow with additional details.

## 2021-11-21 NOTE — Progress Notes (Signed)
(  Key: [id number])  [employer] has not yet replied to your appeal. You may close this dialog, return to your dashboard, and perform other tasks.  To check for an update later, open this request again from your dashboard.  If [employer] has not replied within 24 hours for urgent requests or within 48 hours for standard requests, please contact [employer] at [phone].

## 2021-11-21 NOTE — Progress Notes (Signed)
Appeal sent today

## 2021-11-23 ENCOUNTER — Encounter (HOSPITAL_COMMUNITY): Payer: Self-pay

## 2021-11-23 ENCOUNTER — Other Ambulatory Visit (HOSPITAL_COMMUNITY): Payer: Self-pay

## 2021-11-23 NOTE — Telephone Encounter (Signed)
Wegovy PA and Appeal have been DENIED.  Pt requesting referral to [employer], as per ins co she needs to have proof of participation in a weight management/behavior modification program. Referral placed. Pt aware this does not guarantee she will gain approval for [facility].

## 2021-11-23 NOTE — Addendum Note (Signed)
Addended by: Wadie Lessen on: 11/23/2021 04:56 PM   Modules accepted: Orders

## 2021-11-29 ENCOUNTER — Other Ambulatory Visit (HOSPITAL_COMMUNITY): Payer: Self-pay

## 2021-11-29 MED FILL — Pantoprazole Sodium EC Tab 40 MG (Base Equiv): ORAL | 90 days supply | Qty: 90 | Fill #2 | Status: AC

## 2021-11-30 ENCOUNTER — Other Ambulatory Visit (HOSPITAL_COMMUNITY): Payer: Self-pay

## 2021-12-04 ENCOUNTER — Telehealth: Payer: Self-pay | Admitting: Family Medicine

## 2021-12-04 NOTE — Telephone Encounter (Signed)
Patient's spouse dropped off patient's proof of enrollment in weight loss and exercise program for provider; needed for insurance to cover script for wegovy 1.7mg .  Requesting for paperwork to be faxed to [employer].  Paperwork placed in nurse's folder.   Please advise at [phone].

## 2021-12-06 NOTE — Telephone Encounter (Signed)
New PA started for [facility] with [employer]. No PA# provided, they asked we call back for this information. Paperwork provided by patient regarding No[employer] subscription has been faxed to [employer] for review

## 2021-12-06 NOTE — Telephone Encounter (Signed)
New PA started for [facility] with [employer]. No PA# provided, they asked we call back for this information. Paperwork provided by patient regarding [employer] subscription has been faxed to [employer] for review.

## 2022-01-16 ENCOUNTER — Encounter: Payer: Self-pay | Admitting: Family Medicine

## 2022-04-18 ENCOUNTER — Other Ambulatory Visit (HOSPITAL_COMMUNITY): Payer: Self-pay

## 2022-05-18 ENCOUNTER — Other Ambulatory Visit (HOSPITAL_COMMUNITY): Payer: Self-pay

## 2022-05-18 ENCOUNTER — Ambulatory Visit (INDEPENDENT_AMBULATORY_CARE_PROVIDER_SITE_OTHER): Payer: No Typology Code available for payment source | Admitting: Family Medicine

## 2022-05-18 VITALS — BP 130/70 | HR 73 | Temp 98.0°F | Ht 63.0 in | Wt 226.4 lb

## 2022-05-18 DIAGNOSIS — Z1211 Encounter for screening for malignant neoplasm of colon: Secondary | ICD-10-CM | POA: Diagnosis not present

## 2022-05-18 DIAGNOSIS — E038 Other specified hypothyroidism: Secondary | ICD-10-CM

## 2022-05-18 DIAGNOSIS — E7800 Pure hypercholesterolemia, unspecified: Secondary | ICD-10-CM

## 2022-05-18 DIAGNOSIS — Z0000 Encounter for general adult medical examination without abnormal findings: Secondary | ICD-10-CM | POA: Diagnosis not present

## 2022-05-18 MED ORDER — BUPROPION HCL ER (XL) 150 MG PO TB24
150.0000 mg | ORAL_TABLET | Freq: Every day | ORAL | 11 refills | Status: DC
  Filled 2022-05-18: qty 30, 30d supply, fill #0
  Filled 2022-07-02: qty 30, 30d supply, fill #1

## 2022-05-18 NOTE — Progress Notes (Signed)
Subjective:    Patient ID: Karen [name], female    DOB: Apr 04, 1976, 46 y.o.   MRN: [id number]  Patient is here today for complete physical exam.  Patient's last mammogram was in September of last year.  Is due this year.  Patient would like to schedule this herself.  Her last Pap smear was in 2021.  Is due next year.  She is due for a colonoscopy.  Her blood pressure is excellent.  Her insurance would not cover [facility] for weight loss.  In the past she tried Contrave and had success.  She would like to try Wellbutrin.  Otherwise she is doing well with no concerns.  Past Medical History:  Diagnosis Date   Anxiety    Depression    GERD (gastroesophageal reflux disease)    Hypothyroidism    subclinical/borderline   Smoker 02/23/2013   Smoker    Thyroid disease    Phreesia 03/07/2020   Past Surgical History:  Procedure Laterality Date   BREAST LUMPECTOMY WITH RADIOACTIVE SEED LOCALIZATION Right 07/20/2020   Procedure: RIGHT BREAST LUMPECTOMY WITH RADIOACTIVE SEED LOCALIZATION;  Surgeon: Harriette Bouillon, MD;  Location: [facility];  Service: General;  Laterality: Right;   CHOLECYSTECTOMY N/A    age 89   CHOLECYSTECTOMY, LAPAROSCOPIC      Current Outpatient Medications on File Prior to Visit  Medication Sig Dispense Refill   aspirin EC 81 MG tablet Take 81 mg by mouth daily.     fish oil-omega-3 fatty acids 1000 MG capsule Take 1,000 mg by mouth daily.      levothyroxine (SYNTHROID) 88 MCG tablet Take one tablet by mouth daily. 90 tablet 3   pantoprazole (PROTONIX) 40 MG tablet TAKE 1 TABLET BY MOUTH DAILY BEFORE BREAKFAST. 90 tablet 3   Semaglutide-Weight Management (WEGOVY) 1.7 MG/0.75ML SOAJ Inject 1.7 mg into the skin once a week. (Patient not taking: Reported on 05/18/2022) 3 mL 3   No current facility-administered medications on file prior to visit.   Allergies  Allergen Reactions   Penicillins Hives   Social History   Socioeconomic History   Marital status:  Married    Spouse name: Not on file   Number of children: Not on file   Years of education: Not on file   Highest education level: Not on file  Occupational History   Not on file  Tobacco Use   Smoking status: Former    Types: Cigarettes    Quit date: 2017    Years since quitting: 6.5   Smokeless tobacco: Former    Quit date: 03/25/2016  Substance and Sexual Activity   Alcohol use: Not Currently    Alcohol/week: 0.0 standard drinks of alcohol    Comment: Rare-maybe once a year   Drug use: No   Sexual activity: Yes  Other Topics Concern   Not on file  Social History Narrative   Not on file   Social Determinants of Health   Financial Resource Strain: Not on file  Food Insecurity: Not on file  Transportation Needs: Not on file  Physical Activity: Not on file  Stress: Not on file  Social Connections: Not on file  Intimate Partner Violence: Not on file   Family History  Problem Relation Age of Onset   Diabetes Mother    Hyperlipidemia Mother    Hypertension Mother    Hypertension Father    Colon cancer Neg Hx      Review of Systems     Objective:  Physical Exam Constitutional:      General: She is not in acute distress.    Appearance: She is well-developed. She is not diaphoretic.  HENT:     Head: Normocephalic and atraumatic.     Right Ear: External ear normal.     Left Ear: External ear normal.     Nose: Nose normal.     Mouth/Throat:     Pharynx: No oropharyngeal exudate.  Eyes:     General: No scleral icterus.       Right eye: No discharge.        Left eye: No discharge.     Conjunctiva/sclera: Conjunctivae normal.     Pupils: Pupils are equal, round, and reactive to light.  Neck:     Thyroid: No thyromegaly.     Vascular: No JVD.     Trachea: No tracheal deviation.  Cardiovascular:     Rate and Rhythm: Normal rate and regular rhythm.     Heart sounds: Normal heart sounds. No murmur heard.    No friction rub. No gallop.  Pulmonary:     Effort:  Pulmonary effort is normal. No respiratory distress.     Breath sounds: Normal breath sounds. No stridor. No wheezing or rales.  Chest:     Chest wall: No tenderness.  Abdominal:     General: Bowel sounds are normal. There is no distension.     Palpations: Abdomen is soft.     Tenderness: There is no abdominal tenderness. There is no guarding or rebound.  Musculoskeletal:        General: No tenderness or deformity. Normal range of motion.     Cervical back: Normal range of motion and neck supple.  Lymphadenopathy:     Cervical: No cervical adenopathy.  Skin:    General: Skin is warm.     Coloration: Skin is not pale.  Neurological:     Mental Status: She is alert and oriented to person, place, and time.     Cranial Nerves: No cranial nerve deficit.     Motor: No abnormal muscle tone.     Coordination: Coordination normal.     Deep Tendon Reflexes: Reflexes are normal and symmetric.  Psychiatric:        Behavior: Behavior normal.        Thought Content: Thought content normal.        Judgment: Judgment normal.           Assessment & Plan:  Pure hypercholesterolemia - Plan: COMPLETE METABOLIC PANEL WITH GFR, Lipid panel  Colon cancer screening - Plan: Ambulatory referral to Gastroenterology  Morbid obesity [facility])  General medical exam  Other specified hypothyroidism Labs were checked in January and showed hyperlipidemia but were otherwise normal.  Repeat a CMP with a lipid panel.  Start fish oil 2000 mg daily.  Recommended 1200 mg a day of calcium and 1000 units a day of vitamin D.  We will begin Wellbutrin XL 150 mg daily to see if we can achieve some weight loss off label.  Consult GI for colonoscopy.  Patient will schedule her mammogram.  TSH was normal in January so I do not feel that we need to recheck that today.  Pap smear is not due until next year.

## 2022-05-19 LAB — LIPID PANEL
Cholesterol: 254 mg/dL — ABNORMAL HIGH (ref <200)
HDL: 60 mg/dL (ref >=50)
LDL Cholesterol (Calc): 166 mg/dL (calc) — ABNORMAL HIGH
Non-HDL Cholesterol (Calc): 194 mg/dL (calc) — ABNORMAL HIGH (ref <130)
Total CHOL/HDL Ratio: 4.2 (calc) (ref <5.0)
Triglycerides: 142 mg/dL (ref <150)

## 2022-05-19 LAB — COMPLETE METABOLIC PANEL WITH GFR
AG Ratio: 1.6 (calc) (ref 1.0–2.5)
ALT: 12 U/L (ref 6–29)
AST: 13 U/L (ref 10–35)
Albumin: 4.4 g/dL (ref 3.6–5.1)
Alkaline phosphatase (APISO): 75 U/L (ref 31–125)
BUN: 10 mg/dL (ref 7–25)
CO2: 24 mmol/L (ref 20–32)
Calcium: 9.7 mg/dL (ref 8.6–10.2)
Chloride: 107 mmol/L (ref 98–110)
Creat: 0.67 mg/dL (ref 0.50–0.99)
Globulin: 2.8 g/dL (calc) (ref 1.9–3.7)
Glucose, Bld: 103 mg/dL — ABNORMAL HIGH (ref 65–99)
Potassium: 4.4 mmol/L (ref 3.5–5.3)
Sodium: 139 mmol/L (ref 135–146)
Total Bilirubin: 0.2 mg/dL (ref 0.2–1.2)
Total Protein: 7.2 g/dL (ref 6.1–8.1)
eGFR: 109 mL/min/{1.73_m2} (ref >=60)

## 2022-05-21 ENCOUNTER — Telehealth: Payer: Self-pay

## 2022-05-21 ENCOUNTER — Other Ambulatory Visit: Payer: Self-pay | Admitting: Family Medicine

## 2022-05-21 DIAGNOSIS — Z0289 Encounter for other administrative examinations: Secondary | ICD-10-CM

## 2022-05-21 MED ORDER — ORLISTAT 120 MG PO CAPS
120.0000 mg | ORAL_CAPSULE | Freq: Three times a day (TID) | ORAL | 3 refills | Status: DC
  Filled 2022-05-21: qty 90, 30d supply, fill #0

## 2022-05-21 NOTE — Telephone Encounter (Signed)
Pt would like to try Orlistat ? For cholesterol and wt? Instead of Crestor.   Please advice

## 2022-05-21 NOTE — Telephone Encounter (Signed)
Yes, pt would like a script

## 2022-05-22 ENCOUNTER — Other Ambulatory Visit (HOSPITAL_COMMUNITY): Payer: Self-pay

## 2022-05-31 ENCOUNTER — Encounter (INDEPENDENT_AMBULATORY_CARE_PROVIDER_SITE_OTHER): Payer: Self-pay | Admitting: Family Medicine

## 2022-05-31 ENCOUNTER — Ambulatory Visit (INDEPENDENT_AMBULATORY_CARE_PROVIDER_SITE_OTHER): Payer: No Typology Code available for payment source | Admitting: Family Medicine

## 2022-05-31 ENCOUNTER — Other Ambulatory Visit (HOSPITAL_COMMUNITY): Payer: Self-pay

## 2022-05-31 VITALS — BP 119/79 | HR 72 | Temp 98.1°F | Ht 63.0 in | Wt 224.0 lb

## 2022-05-31 DIAGNOSIS — E559 Vitamin D deficiency, unspecified: Secondary | ICD-10-CM | POA: Insufficient documentation

## 2022-05-31 DIAGNOSIS — R0602 Shortness of breath: Secondary | ICD-10-CM | POA: Insufficient documentation

## 2022-05-31 DIAGNOSIS — F418 Other specified anxiety disorders: Secondary | ICD-10-CM | POA: Insufficient documentation

## 2022-05-31 DIAGNOSIS — E038 Other specified hypothyroidism: Secondary | ICD-10-CM | POA: Diagnosis not present

## 2022-05-31 DIAGNOSIS — E7849 Other hyperlipidemia: Secondary | ICD-10-CM | POA: Diagnosis not present

## 2022-05-31 DIAGNOSIS — R7303 Prediabetes: Secondary | ICD-10-CM | POA: Diagnosis not present

## 2022-05-31 DIAGNOSIS — R5383 Other fatigue: Secondary | ICD-10-CM

## 2022-05-31 DIAGNOSIS — Z6839 Body mass index (BMI) 39.0-39.9, adult: Secondary | ICD-10-CM

## 2022-05-31 DIAGNOSIS — E669 Obesity, unspecified: Secondary | ICD-10-CM

## 2022-06-01 ENCOUNTER — Other Ambulatory Visit (HOSPITAL_COMMUNITY): Payer: Self-pay

## 2022-06-05 LAB — CBC
Hematocrit: 46.2 % (ref 34.0–46.6)
Hemoglobin: 15.7 g/dL (ref 11.1–15.9)
MCH: 29.4 pg (ref 26.6–33.0)
MCHC: 34 g/dL (ref 31.5–35.7)
MCV: 87 fL (ref 79–97)
Platelets: 268 10*3/uL (ref 150–450)
RBC: 5.34 x10E6/uL — ABNORMAL HIGH (ref 3.77–5.28)
RDW: 14.1 % (ref 11.7–15.4)
WBC: 7.6 10*3/uL (ref 3.4–10.8)

## 2022-06-05 LAB — VITAMIN B12: Vitamin B-12: 597 pg/mL (ref 232–1245)

## 2022-06-05 LAB — HEMOGLOBIN A1C
Est. average glucose Bld gHb Est-mCnc: 126 mg/dL
Hgb A1c MFr Bld: 6 % — ABNORMAL HIGH (ref 4.8–5.6)

## 2022-06-05 LAB — TSH RFX ON ABNORMAL TO FREE T4: TSH: 3.61 u[IU]/mL (ref 0.450–4.500)

## 2022-06-05 LAB — VITAMIN D, 25-HYDROXY, TOTAL: Vitamin D, 25-Hydroxy, Serum: 51 ng/mL

## 2022-06-05 LAB — INSULIN, RANDOM: INSULIN: 20.7 u[IU]/mL (ref 2.6–24.9)

## 2022-06-11 NOTE — Progress Notes (Unsigned)
Chief Complaint:   OBESITY Karen [name] (MR# [id number]) is a 46 y.o. female who presents for evaluation and treatment of obesity and related comorbidities. Current BMI is Body mass index is 39.68 kg/m. Karen [name] has been struggling with her weight for many years and has been unsuccessful in either losing weight, maintaining weight loss, or reaching her healthy weight goal.  Karen [name] has struggled with weight gain since early 30's. She was previously 145 lbs. 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 Her husband likes to cook meat and starch. Usually has 2 meals per day and snacks. Denies sugar cravings. Brings food to work. Avoids sugar sweetened beverages. Drinks black coffee. Lacks exercise. Denies large portions. Lacks energy. Has started Xenical prescription.   Karen [name] is currently in the action stage of change and ready to dedicate time achieving and maintaining a healthier weight. Karen [name] is interested in becoming our patient and working on intensive lifestyle modifications including (but not limited to) diet and exercise for weight loss.  Karen [name]'s habits were reviewed today and are as follows: she struggles with family and or coworkers weight loss sabotage, her desired weight loss is 64 lbs, she started gaining weight at age 62, her heaviest weight ever was 245 pounds, she has significant food cravings issues, she skips meals frequently, she is frequently drinking liquids with calories, she frequently makes poor food choices, she frequently eats larger portions than normal, and she struggles with emotional eating.  Depression Screen Karen [name]'s Food and Mood (modified PHQ-9) score was 11.     05/31/2022    7:44 AM  Depression screen PHQ 2/9  Decreased Interest 2  Down, Depressed, Hopeless 0  PHQ - 2 Score 2  Altered sleeping 1  Tired, decreased energy 1  Change in appetite 1  Feeling bad or failure about yourself  3  Trouble concentrating 2  Moving slowly or  fidgety/restless 1  Suicidal thoughts 0  PHQ-9 Score 11  Difficult doing work/chores Somewhat difficult   Subjective:   1. Other fatigue [name] admits to daytime somnolence and admits to waking up still tired. Patient has a history of symptoms of daytime fatigue, morning fatigue, and morning headache. [name] generally gets 6 hours of sleep per night, and states that she works night shifts. Snoring is present. Apneic episodes are present. Epworth Sleepiness Score is 5.   2. SOBOE (shortness of breath on exertion) Karen [name] notes increasing shortness of breath with exercising and seems to be worsening over time with weight gain. She notes getting out of breath sooner with activity than she used to. This has not gotten worse recently. [name] denies shortness of breath at rest or orthopnea.  3. Other hyperlipidemia Karen [name] is on fish oil 2,000 mg once daily. PCP sent in Crestor but she didn't start. She has a family history of high cholesterol.   4. Vitamin D deficiency Karen [name] is on OTC Vitamin D 14,000 IU weekly. She complains of fatigue.   5. Prediabetes Karen [name]'s last A1c was 5.2 on 11/13/2021, normal. She enjoys starches more than sweets.   6. Other specified hypothyroidism Karen [name] is on levothyroxine 88 mcg daily.   7. Depression with anxiety Karen [name] is on Wellbutrin XL 150 mg daily, and she notes it is helping with her mood. Not really seeing a decrease in carbohydrates/sugar cravings yet.   Assessment/Plan:   1. Other fatigue [name] does feel that her weight is causing her energy to be lower than it should  be. Fatigue may be related to obesity, depression or many other causes. Labs will be ordered, and in the meanwhile, [name] will focus on self care including making healthy food choices, increasing physical activity and focusing on stress reduction.  - EKG 12-Lead - Insulin, random - Hemoglobin A1c - Vitamin D, 25-Hydroxy, Total - Vitamin B12 - CBC  2. SOBOE (shortness of breath on exertion) Karen [name] does  feel that she gets out of breath more easily that she used to when she exercises. [name]'s shortness of breath appears to be obesity related and exercise induced. She has agreed to work on weight loss and gradually increase exercise to treat her exercise induced shortness of breath. Will continue to monitor closely.  3. Other hyperlipidemia Karen [name] will begin decreasing her intake of saturated fat. We will plan to recheck FLP in 3 months.   4. Vitamin D deficiency We will check labs today, and we will follow-up at Karen [name]'s next visit.  5. Prediabetes We will check labs today, and we will follow-up at Karen [name]'s next visit.   6. Other specified hypothyroidism We will check labs today, and we will follow-up at Karen [name]'s next visit.   - TSH Rfx on Abnormal to Free T4  7. Depression with anxiety Karen [name] will continue Wellbutrin XL 150 mg every morning per her PCP.   8. Obesity, current BMI 39.7 Karen [name] is currently in the action stage of change and her goal is to continue with weight loss efforts. I recommend Karen [name] begin the structured treatment plan as follows:  She has agreed to the Category 2 Plan.  Consider Wegovy in the future with change of insurance plan.  Exercise goals: Smart watch daily to track steps.    Behavioral modification strategies: increasing lean protein intake, increasing water intake, no skipping meals, keeping healthy foods in the home, better snacking choices, and decreasing junk food.  She was informed of the importance of frequent follow-up visits to maximize her success with intensive lifestyle modifications for her multiple health conditions. She was informed we would discuss her lab results at her next visit unless there is a critical issue that needs to be addressed sooner. Karen [name] agreed to keep her next visit at the agreed upon time to discuss these results.  Objective:   Blood pressure 119/79, pulse 72, temperature 98.1 F (36.7 C), height 5\' 3"  (1.6 m), weight 224 lb (101.6  kg), last menstrual period 05/09/2022, SpO2 95 %. Body mass index is 39.68 kg/m.  EKG: Normal sinus rhythm, rate 74 BPM.  Indirect Calorimeter completed today shows a VO2 of 250 and a REE of 1728.  Her calculated basal metabolic rate is 05/11/2022 thus her basal metabolic rate is better than expected.  General: Cooperative, alert, well developed, in no acute distress. HEENT: Conjunctivae and lids unremarkable. Cardiovascular: Regular rhythm.  Lungs: Normal work of breathing. Neurologic: No focal deficits.   Lab Results  Component Value Date   CREATININE 0.67 05/18/2022   BUN 10 05/18/2022   NA 139 05/18/2022   K 4.4 05/18/2022   CL 107 05/18/2022   CO2 24 05/18/2022   Lab Results  Component Value Date   ALT 12 05/18/2022   AST 13 05/18/2022   ALKPHOS 73 12/27/2016   BILITOT 0.2 05/18/2022   Lab Results  Component Value Date   HGBA1C 6.0 (H) 05/31/2022   HGBA1C 5.2 11/13/2021   Lab Results  Component Value Date   INSULIN 20.7 05/31/2022   Lab Results  Component Value Date   TSH  3.610 05/31/2022   Lab Results  Component Value Date   CHOL 254 (H) 05/18/2022   HDL 60 05/18/2022   LDLCALC 166 (H) 05/18/2022   TRIG 142 05/18/2022   CHOLHDL 4.2 05/18/2022   Lab Results  Component Value Date   WBC 7.6 05/31/2022   HGB 15.7 05/31/2022   HCT 46.2 05/31/2022   MCV 87 05/31/2022   PLT 268 05/31/2022   No results found for: "IRON", "TIBC", "FERRITIN"   Attestation Statements:   Reviewed by clinician on day of visit: allergies, medications, problem list, medical history, surgical history, family history, social history, and previous encounter notes.   Trude Mcburney, am acting as transcriptionist for Seymour Bars, DO.  I have reviewed the above documentation for accuracy and completeness, and I agree with the above. Glennis Brink, DO

## 2022-06-14 ENCOUNTER — Ambulatory Visit (INDEPENDENT_AMBULATORY_CARE_PROVIDER_SITE_OTHER): Payer: No Typology Code available for payment source | Admitting: Family Medicine

## 2022-06-14 ENCOUNTER — Encounter (INDEPENDENT_AMBULATORY_CARE_PROVIDER_SITE_OTHER): Payer: Self-pay | Admitting: Family Medicine

## 2022-06-14 VITALS — BP 138/87 | HR 75 | Temp 98.3°F | Ht 63.0 in | Wt 224.0 lb

## 2022-06-14 DIAGNOSIS — F3289 Other specified depressive episodes: Secondary | ICD-10-CM

## 2022-06-14 DIAGNOSIS — E669 Obesity, unspecified: Secondary | ICD-10-CM

## 2022-06-14 DIAGNOSIS — E8881 Metabolic syndrome: Secondary | ICD-10-CM | POA: Diagnosis not present

## 2022-06-14 DIAGNOSIS — Z6839 Body mass index (BMI) 39.0-39.9, adult: Secondary | ICD-10-CM

## 2022-06-14 DIAGNOSIS — E559 Vitamin D deficiency, unspecified: Secondary | ICD-10-CM | POA: Diagnosis not present

## 2022-06-14 DIAGNOSIS — R7303 Prediabetes: Secondary | ICD-10-CM | POA: Diagnosis not present

## 2022-06-14 DIAGNOSIS — F32A Depression, unspecified: Secondary | ICD-10-CM | POA: Insufficient documentation

## 2022-06-24 NOTE — Progress Notes (Unsigned)
Chief Complaint:   OBESITY Karen [name] is here to discuss her progress with her obesity treatment plan along with follow-up of her obesity related diagnoses. Karen [name] is on the Category 2 Plan and states she is following her eating plan approximately 50% of the time. Karen [name] states she is walking on treadmill 20 minutes 2 times per week.  Today's visit was #: 2 Starting weight: 224 lbs Starting date: 05/31/2022 Today's weight: 224 lbs Today's date: 06/14/2022 Total lbs lost to date: 0 Total lbs lost since last in-office visit: 0  Interim History: getting used to working nights.  Improving sleep.  Husband is supportive and  helps with cooking.  Brings healthy foods to work.  Not meal skipping.  Has a treadmill, not using yet.  Has a smart watch.  Had previously tried [place], [place], Ozempic.  Liked how she felt on Ozempic, has not started prescription Xenical, given by PCP.  Felt jittery on Phentermine.  Subjective:   1. Prediabetes New diagnosis. Discussed labs with patient today. A1c 6.0.  has started to decrease the intake of sugar. Never used Metformin.  2. Insulin resistance Discussed labs with patient today. Fasting insulin increased to 20.7.  Has started dietary changes and increased walking time.   3. Vitamin D deficiency Discussed labs with patient today. Vitamin D level good at 51.  On OTC Vitamin D per PCP.  4. Other depression with emotional eating On Wellbutrin XL 150 mg daily, last PHQ-9:11, fair support at home and work.   Assessment/Plan:   1. Prediabetes Continue to work on intake of starches and sweets.  2. Insulin resistance Recheck A1c and fasting insulin in 3-4 months.   3. Vitamin D deficiency Continue OTC Vitamin D daily.   4. Other depression with emotional eating Continue to work on sleep, stress reduction and mindful eating.   5. Obesity, Current BMI 39.8 Eating out hand out given to patient.   Karen [name] is currently in the action stage of change. As  such, her goal is to continue with weight loss efforts. She has agreed to the Category 2 Plan and keeping a food journal and adhering to recommended goals of 1200-1400 calories and 90 protein.   Exercise goals:  track steps daily.  Behavioral modification strategies: increasing lean protein intake, decreasing simple carbohydrates, increasing vegetables, increasing water intake, decreasing eating out, no skipping meals, meal planning and cooking strategies, keeping healthy foods in the home, and decreasing junk food.  Karen [name] has agreed to follow-up with our clinic in 3 weeks. She was informed of the importance of frequent follow-up visits to maximize her success with intensive lifestyle modifications for her multiple health conditions.   Objective:   Blood pressure 138/87, pulse 75, temperature 98.3 F (36.8 C), height 5\' 3"  (1.6 m), weight 224 lb (101.6 kg), last menstrual period 05/09/2022, SpO2 96 %. Body mass index is 39.68 kg/m.  General: Cooperative, alert, well developed, in no acute distress. HEENT: Conjunctivae and lids unremarkable. Cardiovascular: Regular rhythm.  Lungs: Normal work of breathing. Neurologic: No focal deficits.   Lab Results  Component Value Date   CREATININE 0.67 05/18/2022   BUN 10 05/18/2022   NA 139 05/18/2022   K 4.4 05/18/2022   CL 107 05/18/2022   CO2 24 05/18/2022   Lab Results  Component Value Date   ALT 12 05/18/2022   AST 13 05/18/2022   ALKPHOS 73 12/27/2016   BILITOT 0.2 05/18/2022   Lab Results  Component Value Date   HGBA1C  6.0 (H) 05/31/2022   HGBA1C 5.2 11/13/2021   Lab Results  Component Value Date   INSULIN 20.7 05/31/2022   Lab Results  Component Value Date   TSH 3.610 05/31/2022   Lab Results  Component Value Date   CHOL 254 (H) 05/18/2022   HDL 60 05/18/2022   LDLCALC 166 (H) 05/18/2022   TRIG 142 05/18/2022   CHOLHDL 4.2 05/18/2022   Lab Results  Component Value Date   VD25OH 60 03/16/2014   Lab Results   Component Value Date   WBC 7.6 05/31/2022   HGB 15.7 05/31/2022   HCT 46.2 05/31/2022   MCV 87 05/31/2022   PLT 268 05/31/2022   No results found for: "IRON", "TIBC", "FERRITIN"  Attestation Statements:   Reviewed by clinician on day of visit: allergies, medications, problem list, medical history, surgical history, family history, social history, and previous encounter notes.  I, Malcolm Metro, am acting as Energy manager for Seymour Bars, DO.  I have reviewed the above documentation for accuracy and completeness, and I agree with the above. -  ***

## 2022-06-25 ENCOUNTER — Telehealth (INDEPENDENT_AMBULATORY_CARE_PROVIDER_SITE_OTHER): Payer: Self-pay | Admitting: Family Medicine

## 2022-06-28 ENCOUNTER — Ambulatory Visit (INDEPENDENT_AMBULATORY_CARE_PROVIDER_SITE_OTHER): Payer: No Typology Code available for payment source | Admitting: Family Medicine

## 2022-06-28 ENCOUNTER — Encounter (INDEPENDENT_AMBULATORY_CARE_PROVIDER_SITE_OTHER): Payer: Self-pay | Admitting: Family Medicine

## 2022-06-28 ENCOUNTER — Other Ambulatory Visit (HOSPITAL_COMMUNITY): Payer: Self-pay

## 2022-06-28 ENCOUNTER — Other Ambulatory Visit (HOSPITAL_COMMUNITY): Payer: Self-pay | Admitting: Family Medicine

## 2022-06-28 VITALS — BP 125/83 | HR 78 | Temp 98.6°F | Ht 63.0 in | Wt 224.0 lb

## 2022-06-28 DIAGNOSIS — R7303 Prediabetes: Secondary | ICD-10-CM

## 2022-06-28 DIAGNOSIS — Z1231 Encounter for screening mammogram for malignant neoplasm of breast: Secondary | ICD-10-CM

## 2022-06-28 DIAGNOSIS — E038 Other specified hypothyroidism: Secondary | ICD-10-CM

## 2022-06-28 DIAGNOSIS — E669 Obesity, unspecified: Secondary | ICD-10-CM

## 2022-06-28 DIAGNOSIS — Z6839 Body mass index (BMI) 39.0-39.9, adult: Secondary | ICD-10-CM | POA: Diagnosis not present

## 2022-07-02 ENCOUNTER — Ambulatory Visit (HOSPITAL_COMMUNITY)
Admission: RE | Admit: 2022-07-02 | Discharge: 2022-07-02 | Disposition: A | Discharge status: Home / Self Care | Payer: No Typology Code available for payment source | Source: Ambulatory Visit | Attending: Family Medicine | Admitting: Family Medicine

## 2022-07-02 ENCOUNTER — Other Ambulatory Visit (HOSPITAL_COMMUNITY): Payer: Self-pay

## 2022-07-02 DIAGNOSIS — Z1231 Encounter for screening mammogram for malignant neoplasm of breast: Secondary | ICD-10-CM | POA: Diagnosis present

## 2022-07-02 NOTE — Progress Notes (Unsigned)
Chief Complaint:   OBESITY Karen [name] is here to discuss her progress with her obesity treatment plan along with follow-up of her obesity related diagnoses. Karen [name] is on the Category 2 Plan and states she is following her eating plan approximately 85% of the time. Karen [name] states she is using treadmill 25 minutes 2-3 times per week.  Today's visit was #: 3 Starting weight: 224 lbs Starting date: 05/31/2022 Today's weight: 224 lbs Today's date: 06/28/2022 Total lbs lost to date: 0 lbs Total lbs lost since last in-office visit: 0  Interim History: Karen [name] is a new patient to me. She has seen Dr. Cathey Endow for 1st 2 appointments. Has increased protein over last few weeks. Following more of Cat 2. She is working nights now. Eggs (3-4 egg whites) 2 pisces of [place] bacon +1 slice of bread. Lunch is chicken wrap = cheese (<1/4 cup chicken) + yogurt. Snack is protein shake (plant based). Supper is steak + veggies or taco or chili.  Subjective:   1. Prediabetes Imogene's last A1c was 6.0, insulin was 20.7. Previously on Ozempic + Saxenda ( has not tried Metformin).  2. Other specified hypothyroidism Karen [name] is on Levothyroxine 88 mcg. Denies any chest pain,shortness of breath or palpitations. Her last TSH 3.61.  Assessment/Plan:   1. Prediabetes Will repeat labs in 2 months.  2. Other specified hypothyroidism Continue Levothyroxine without any changes in dosage.  3. Obesity with current BMI of 39.7 Karen [name] is currently in the action stage of change. As such, her goal is to continue with weight loss efforts. She has agreed to the Category 3 Plan with 8oz at supper.  Exercise goals: All adults should avoid inactivity. Some physical activity is better than none, and adults who participate in any amount of physical activity gain some health benefits.  Behavioral modification strategies: increasing lean protein intake, meal planning and cooking strategies, keeping healthy foods in the home, and planning for  success.  Brett has agreed to follow-up with our clinic in 3 weeks. She was informed of the importance of frequent follow-up visits to maximize her success with intensive lifestyle modifications for her multiple health conditions.   Objective:   Blood pressure 125/83, pulse 78, temperature 98.6 F (37 C), height 5\' 3"  (1.6 m), weight 224 lb (101.6 kg), last menstrual period 05/09/2022, SpO2 98 %. Body mass index is 39.68 kg/m.  General: Cooperative, alert, well developed, in no acute distress. HEENT: Conjunctivae and lids unremarkable. Cardiovascular: Regular rhythm.  Lungs: Normal work of breathing. Neurologic: No focal deficits.   Lab Results  Component Value Date   CREATININE 0.67 05/18/2022   BUN 10 05/18/2022   NA 139 05/18/2022   K 4.4 05/18/2022   CL 107 05/18/2022   CO2 24 05/18/2022   Lab Results  Component Value Date   ALT 12 05/18/2022   AST 13 05/18/2022   ALKPHOS 73 12/27/2016   BILITOT 0.2 05/18/2022   Lab Results  Component Value Date   HGBA1C 6.0 (H) 05/31/2022   HGBA1C 5.2 11/13/2021   Lab Results  Component Value Date   INSULIN 20.7 05/31/2022   Lab Results  Component Value Date   TSH 3.610 05/31/2022   Lab Results  Component Value Date   CHOL 254 (H) 05/18/2022   HDL 60 05/18/2022   LDLCALC 166 (H) 05/18/2022   TRIG 142 05/18/2022   CHOLHDL 4.2 05/18/2022   Lab Results  Component Value Date   VD25OH 60 03/16/2014   Lab Results  Component  Value Date   WBC 7.6 05/31/2022   HGB 15.7 05/31/2022   HCT 46.2 05/31/2022   MCV 87 05/31/2022   PLT 268 05/31/2022   No results found for: "IRON", "TIBC", "FERRITIN"  Attestation Statements:   Reviewed by clinician on day of visit: allergies, medications, problem list, medical history, surgical history, family history, social history, and previous encounter notes.  I, Elnora Morrison, RMA am acting as transcriptionist for Coralie Common, MD.  I have reviewed the above documentation for  accuracy and completeness, and I agree with the above. - Coralie Common, MD

## 2022-07-03 ENCOUNTER — Other Ambulatory Visit (HOSPITAL_COMMUNITY): Payer: Self-pay

## 2022-07-18 NOTE — Progress Notes (Unsigned)
TeleHealth Visit:  This visit was completed with telemedicine (audio/video) technology. Karen [name] has verbally consented to this TeleHealth visit. The patient is located at home, the provider is located at home. The participants in this visit include the listed provider and patient. The visit was conducted today via MyChart video.  OBESITY Karen [name] is here to discuss her progress with her obesity treatment plan along with follow-up of her obesity related diagnoses.   Today's visit was # 4 Starting weight: 224 lbs Starting date: 05/31/2022 Weight at last in office visit: 224 lbs on 06/28/22 Total weight loss: 0 lbs at last in office visit on 06/28/22. Today's reported weight: *** lbs No weight reported.   Nutrition Plan: the Category 3 Plan with 8 oz. protein at supper  Current exercise: {exercise types:16438}  Interim History: ***  Assessment/Plan:  1. ***  2. ***  3. ***  Obesity: Current BMI *** Karen [name] {CHL AMB IS/IS NOT:210130109} currently in the action stage of change. As such, her goal is to {MWMwtloss#1:210800005}.  She has agreed to {MWMwtlossportion/plan2:23431}.   Exercise goals: {MWM EXERCISE RECS:23473}  Behavioral modification strategies: {MWMwtlossdietstrategies3:23432}.  Karen [name] has agreed to follow-up with our clinic in {NUMBER 1-10:22536} weeks.   No orders of the defined types were placed in this encounter.   There are no discontinued medications.   No orders of the defined types were placed in this encounter.     Objective:   VITALS: Per patient if applicable, see vitals. GENERAL: Alert and in no acute distress. CARDIOPULMONARY: No increased WOB. Speaking in clear sentences.  PSYCH: Pleasant and cooperative. Speech normal rate and rhythm. Affect is appropriate. Insight and judgement are appropriate. Attention is focused, linear, and appropriate.  NEURO: Oriented as arrived to appointment on time with no prompting.   Lab Results  Component Value Date    CREATININE 0.67 05/18/2022   BUN 10 05/18/2022   NA 139 05/18/2022   K 4.4 05/18/2022   CL 107 05/18/2022   CO2 24 05/18/2022   Lab Results  Component Value Date   ALT 12 05/18/2022   AST 13 05/18/2022   ALKPHOS 73 12/27/2016   BILITOT 0.2 05/18/2022   Lab Results  Component Value Date   HGBA1C 6.0 (H) 05/31/2022   HGBA1C 5.2 11/13/2021   Lab Results  Component Value Date   INSULIN 20.7 05/31/2022   Lab Results  Component Value Date   TSH 3.610 05/31/2022   Lab Results  Component Value Date   CHOL 254 (H) 05/18/2022   HDL 60 05/18/2022   LDLCALC 166 (H) 05/18/2022   TRIG 142 05/18/2022   CHOLHDL 4.2 05/18/2022   Lab Results  Component Value Date   WBC 7.6 05/31/2022   HGB 15.7 05/31/2022   HCT 46.2 05/31/2022   MCV 87 05/31/2022   PLT 268 05/31/2022   No results found for: "IRON", "TIBC", "FERRITIN" Lab Results  Component Value Date   VD25OH 60 03/16/2014    Attestation Statements:   Reviewed by clinician on day of visit: allergies, medications, problem list, medical history, surgical history, family history, social history, and previous encounter notes.  ***(delete if time-based billing not used) Time spent on visit including the items listed below was *** minutes.  -preparing to see the patient (e.g., review of tests, history, previous notes) -obtaining and/or reviewing separately obtained history -counseling and educating the patient/family/caregiver -documenting clinical information in the electronic or other health record -ordering medications, tests, or procedures -independently interpreting results and communicating results to the patient/  family/caregiver -referring and communicating with other health care professionals  -care coordination

## 2022-07-19 ENCOUNTER — Telehealth (INDEPENDENT_AMBULATORY_CARE_PROVIDER_SITE_OTHER): Payer: No Typology Code available for payment source | Admitting: Family Medicine

## 2022-07-19 ENCOUNTER — Encounter (INDEPENDENT_AMBULATORY_CARE_PROVIDER_SITE_OTHER): Payer: Self-pay | Admitting: Family Medicine

## 2022-07-19 ENCOUNTER — Other Ambulatory Visit (HOSPITAL_COMMUNITY): Payer: Self-pay

## 2022-07-19 DIAGNOSIS — R7303 Prediabetes: Secondary | ICD-10-CM

## 2022-07-19 DIAGNOSIS — Z6839 Body mass index (BMI) 39.0-39.9, adult: Secondary | ICD-10-CM | POA: Diagnosis not present

## 2022-07-19 DIAGNOSIS — E669 Obesity, unspecified: Secondary | ICD-10-CM | POA: Diagnosis not present

## 2022-08-09 ENCOUNTER — Ambulatory Visit (INDEPENDENT_AMBULATORY_CARE_PROVIDER_SITE_OTHER): Payer: No Typology Code available for payment source | Admitting: Family Medicine

## 2022-08-09 ENCOUNTER — Ambulatory Visit (HOSPITAL_COMMUNITY)
Admission: RE | Admit: 2022-08-09 | Discharge: 2022-08-09 | Disposition: A | Discharge status: Home / Self Care | Payer: No Typology Code available for payment source | Source: Ambulatory Visit | Attending: Family Medicine | Admitting: Family Medicine

## 2022-08-09 VITALS — BP 124/76 | HR 83 | Ht 63.0 in | Wt 230.0 lb

## 2022-08-09 DIAGNOSIS — R091 Pleurisy: Secondary | ICD-10-CM | POA: Diagnosis present

## 2022-08-09 MED ORDER — HYDROCODONE BIT-HOMATROP MBR 5-1.5 MG/5ML PO SOLN: 5.0000 mL | Freq: Three times a day (TID) | ORAL | 0 refills | Status: DC | PRN

## 2022-08-09 MED ORDER — LEVOFLOXACIN 500 MG PO TABS: 500.0000 mg | ORAL_TABLET | Freq: Every day | ORAL | 0 refills | Status: AC

## 2022-08-09 NOTE — Progress Notes (Signed)
Subjective:    Patient ID: Karen [name], female    DOB: [birth year], 46 y.o.   MRN: [id number] Patient states that she has been sick for about 3 weeks.  She has been coughing and having chest congestion.  The cough is productive of trace sputum but no hemoptysis and no purulent sputum.  Yesterday she started feeling sicker.  She also developed left posterior pleurisy.  She states that it hurts when she takes a deep breath.  On examination today, her lungs are clear to auscultation bilaterally.  I do not appreciate any wheezes crackles or rails.  She is somewhat tender to palpation over the ribs and intercostal muscles just posterior to the mid axillary line on the left-hand side.  There is no significant point tenderness.  She denies any significant shortness of breath and she is actually feeling a little bit better today  Past Medical History:  Diagnosis Date   Anxiety    Depression    Gallbladder problem    GERD (gastroesophageal reflux disease)    Gluten intolerance    Heartburn    High cholesterol    Hypothyroidism    subclinical/borderline   Lactose intolerance    Lactose intolerance    Palpitations    Prediabetes    Smoker 02/23/2013   Smoker    SOBOE (shortness of breath on exertion)    Thyroid disease    [employer]reesia 03/07/2020   Vitamin D deficiency    Past Surgical History:  Procedure Laterality Date   BREAST LUMPECTOMY WITH RADIOACTIVE SEED LOCALIZATION Right 07/20/2020   Procedure: RIGHT BREAST LUMPECTOMY WITH RADIOACTIVE SEED LOCALIZATION;  Surgeon: Harriette Bouillon, MD;  [facility]:  [facility];  Service: General;  Laterality: Right;   CHOLECYSTECTOMY N/A    age 4   CHOLECYSTECTOMY, LAPAROSCOPIC      Current Outpatient Medications on File Prior to Visit  Medication Sig Dispense Refill   aspirin EC 81 MG tablet Take 81 mg by mouth daily.     buPROPion (WELLBUTRIN XL) 150 MG 24 hr tablet Take 1 tablet (150 mg total) by mouth daily. 30 tablet 11   fish  oil-omega-3 fatty acids 1000 MG capsule Take 1,000 mg by mouth daily.      levothyroxine (SYNTHROID) 88 MCG tablet Take one tablet by mouth daily. 90 tablet 3   magnesium gluconate (MAGONATE) 500 MG tablet Take 500 mg by mouth 2 (two) times daily.     Multiple Vitamin (MULTIVITAMIN ADULT PO) Take by mouth. 14,000 IU weekly     pantoprazole (PROTONIX) 40 MG tablet TAKE 1 TABLET BY MOUTH DAILY BEFORE BREAKFAST. 90 tablet 3   VITAMIN D PO Take by mouth.     No current facility-administered medications on file prior to visit.   Allergies  Allergen Reactions   Penicillins Hives   Social History   Socioeconomic History   Marital status: Married    Spouse name: Marrian Bells   Number of children: 3   Years of education: Not on file   Highest education level: Not on file  Occupational History   Not on file  Tobacco Use   Smoking status: Former    Packs/day: 0.50    Types: Cigarettes    Quit date: 2017    Years since quitting: 6.8    Passive exposure: Past   Smokeless tobacco: Former    Quit date: 03/25/2016  Vaping Use   Vaping Use: Former   Quit date: 03/25/2016  Substance and Sexual Activity  Alcohol use: Not Currently    Alcohol/week: 0.0 standard drinks of alcohol    Comment: Rare-maybe once a year   Drug use: No   Sexual activity: Yes  Other Topics Concern   Not on file  Social History Narrative   Not on file   Social Determinants of Health   Financial Resource Strain: Not on file  Food Insecurity: Not on file  Transportation Needs: Not on file  Physical Activity: Not on file  Stress: Not on file  Social Connections: Not on file  Intimate Partner Violence: Not on file   Family History  Problem Relation Age of Onset   Diabetes Mother    Hyperlipidemia Mother    Hypertension Mother    Anxiety disorder Mother    Obesity Mother    Anxiety disorder Father    Hypertension Father    Depression Father    Colon cancer Neg Hx      Review of Systems      Objective:   Physical Exam Constitutional:      General: She is not in acute distress.    Appearance: She is well-developed. She is not diaphoretic.  HENT:     Head: Normocephalic and atraumatic.     Right Ear: External ear normal.     Left Ear: External ear normal.     Nose: Nose normal.     Mouth/Throat:     Pharynx: No oropharyngeal exudate.  Eyes:     General: No scleral icterus.       Right eye: No discharge.        Left eye: No discharge.     Conjunctiva/sclera: Conjunctivae normal.     Pupils: Pupils are equal, round, and reactive to light.  Neck:     Thyroid: No thyromegaly.     Vascular: No JVD.     Trachea: No tracheal deviation.  Cardiovascular:     Rate and Rhythm: Normal rate and regular rhythm.     Heart sounds: Normal heart sounds. No murmur heard.    No friction rub. No gallop.  Pulmonary:     Effort: Pulmonary effort is normal. No respiratory distress.     Breath sounds: Normal breath sounds. No stridor. No wheezing or rales.  Chest:     Chest wall: No tenderness.  Abdominal:     General: Bowel sounds are normal. There is no distension.     Palpations: Abdomen is soft.     Tenderness: There is no abdominal tenderness. There is no guarding or rebound.  Musculoskeletal:        General: No tenderness or deformity. Normal range of motion.     Cervical back: Normal range of motion and neck supple.  Lymphadenopathy:     Cervical: No cervical adenopathy.  Skin:    General: Skin is warm.     Coloration: Skin is not pale.  Neurological:     Mental Status: She is alert and oriented to person, place, and time.     Cranial Nerves: No cranial nerve deficit.     Motor: No abnormal muscle tone.     Coordination: Coordination normal.     Deep Tendon Reflexes: Reflexes are normal and symmetric.  Psychiatric:        Behavior: Behavior normal.        Thought Content: Thought content normal.        Judgment: Judgment normal.          Assessment & Plan:   Pleurisy - Plan:  DG Chest 2 View Patient's exam is normal.  Obviously given her history I be concerned about pneumonia.  Therefore I ordered a chest x-ray.  If she does have pneumonia I would recommend Levaquin 500 mg daily for 7 days.  However I suspect that this is most likely musculoskeletal pain due to coughing.  Therefore I recommended Hycodan 1 teaspoon every 8 hours as needed for cough.  I do not believe the patient has a pulmonary embolism.  There is no unilateral leg swelling and she denies any shortness of breath

## 2022-08-14 ENCOUNTER — Ambulatory Visit (INDEPENDENT_AMBULATORY_CARE_PROVIDER_SITE_OTHER): Payer: No Typology Code available for payment source | Admitting: Family Medicine

## 2022-08-23 ENCOUNTER — Encounter (INDEPENDENT_AMBULATORY_CARE_PROVIDER_SITE_OTHER): Payer: Self-pay | Admitting: Family Medicine

## 2022-08-23 ENCOUNTER — Other Ambulatory Visit (HOSPITAL_COMMUNITY): Payer: Self-pay

## 2022-08-23 ENCOUNTER — Ambulatory Visit (INDEPENDENT_AMBULATORY_CARE_PROVIDER_SITE_OTHER): Payer: No Typology Code available for payment source | Admitting: Family Medicine

## 2022-08-23 VITALS — BP 139/88 | HR 77 | Temp 98.8°F | Ht 63.0 in | Wt 227.4 lb

## 2022-08-23 DIAGNOSIS — Z6841 Body Mass Index (BMI) 40.0 and over, adult: Secondary | ICD-10-CM

## 2022-08-23 DIAGNOSIS — F3289 Other specified depressive episodes: Secondary | ICD-10-CM

## 2022-08-23 DIAGNOSIS — E669 Obesity, unspecified: Secondary | ICD-10-CM

## 2022-08-23 DIAGNOSIS — Z6839 Body mass index (BMI) 39.0-39.9, adult: Secondary | ICD-10-CM

## 2022-08-23 MED ORDER — TOPIRAMATE 25 MG PO TABS
25.0000 mg | ORAL_TABLET | Freq: Every day | ORAL | 0 refills | Status: DC
  Filled 2022-08-23: qty 30, 30d supply, fill #0

## 2022-09-03 NOTE — Progress Notes (Addendum)
TeleHealth Visit:  This visit was completed with telemedicine (audio/video) technology. Karen [name] has verbally consented to this TeleHealth visit. The patient is located at home, the provider is located at home. The participants in this visit include the listed provider and patient. The visit was conducted today via MyChart video.  OBESITY Karen [name] is here to discuss her progress with her obesity treatment plan along with follow-up of her obesity related diagnoses.   Today's visit was # 6 Starting weight: 224 lbs Starting date: 05/31/2022 Weight at last in office visit: 227 lbs on 08/23/22 Total weight loss: 0 lbs at last in office visit on 08/23/22. Today's reported weight:  No weight reported.  Nutrition Plan: Category 2 Plan - 80% adherence  Current exercise: none  Interim History: Karen [name] reports she has not quite wrapped her head around following the plan well.  She did not expect to get into our program so soon-she expected a 45-month wait and we called her in 3 to 4 months.  She is eating 3 meals per day.  She has not journaled at all.  She is following category 2.  Typical day of food- Breakfast-4 egg whites, [place] bacon Lunch-wrap with steak Dinner-steak, potatoes or rice, vegetables  Her husband typically cooks.  She says her weakness is potatoes and they have either potatoes or rice with most dinners.  She has a gluten intolerance but has 40-calorie gluten-free bread.  Water intake is adequate.  Denies sugar sweetened beverage intake.  Requests 4-week follow-up.  Assessment/Plan:  1. Eating disorder/emotional eating Karen [name] has had issues with stress/emotional eating.  Says that she has a lot of stress in her work and home life.  Prescribed topiramate 25 mg at bedtime last office visit but she has not started this.  She is bit concerned about side effects.  Plan: Offered referral to Dr. Dewaine Conger and she declines at this time. Discussed side effects of topiramate.  She will  start this this week.  2. Prediabetes Last A1c was 6.0 on 05/31/2022.  Denies polyphagia Medication(s): none Lab Results  Component Value Date   HGBA1C 6.0 (H) 05/31/2022   Lab Results  Component Value Date   INSULIN 20.7 05/31/2022    Plan: Continue to work on close adherence to meal plan. Consider adding metformin in the future.   3. Obesity: Current BMI 40.3 Karen [name] is currently in the action stage of change. As such, her goal is to continue with weight loss efforts.  She has agreed to the Category 2 Plan.   Limit rice and potatoes to 1/2 cup serving. Encouraged better compliance with category 2 for better weight loss.  Exercise goals: No exercise has been prescribed at this time.  Behavioral modification strategies: increasing lean protein intake, decreasing simple carbohydrates, meal planning and cooking strategies, better snacking choices, emotional eating strategies, and planning for success.  Karen [name] has agreed to follow-up with our clinic in 4 weeks.   No orders of the defined types were placed in this encounter.   There are no discontinued medications.   No orders of the defined types were placed in this encounter.     Objective:   VITALS: Per patient if applicable, see vitals. GENERAL: Alert and in no acute distress. CARDIOPULMONARY: No increased WOB. Speaking in clear sentences.  PSYCH: Pleasant and cooperative. Speech normal rate and rhythm. Affect is appropriate. Insight and judgement are appropriate. Attention is focused, linear, and appropriate.  NEURO: Oriented as arrived to appointment on time with no prompting.   Lab  Results  Component Value Date   CREATININE 0.67 05/18/2022   BUN 10 05/18/2022   NA 139 05/18/2022   K 4.4 05/18/2022   CL 107 05/18/2022   CO2 24 05/18/2022   Lab Results  Component Value Date   ALT 12 05/18/2022   AST 13 05/18/2022   ALKPHOS 73 12/27/2016   BILITOT 0.2 05/18/2022   Lab Results  Component Value Date   HGBA1C  6.0 (H) 05/31/2022   HGBA1C 5.2 11/13/2021   Lab Results  Component Value Date   INSULIN 20.7 05/31/2022   Lab Results  Component Value Date   TSH 3.610 05/31/2022   Lab Results  Component Value Date   CHOL 254 (H) 05/18/2022   HDL 60 05/18/2022   LDLCALC 166 (H) 05/18/2022   TRIG 142 05/18/2022   CHOLHDL 4.2 05/18/2022   Lab Results  Component Value Date   WBC 7.6 05/31/2022   HGB 15.7 05/31/2022   HCT 46.2 05/31/2022   MCV 87 05/31/2022   PLT 268 05/31/2022   No results found for: "IRON", "TIBC", "FERRITIN" Lab Results  Component Value Date   VD25OH 60 03/16/2014    Attestation Statements:   Reviewed by clinician on day of visit: allergies, medications, problem list, medical history, surgical history, family history, social history, and previous encounter notes.  Time spent on visit including the items listed below was 30 minutes.  -preparing to see the patient (e.g., review of tests, history, previous notes) -obtaining and/or reviewing separately obtained history -counseling and educating the patient/family/caregiver -documenting clinical information in the electronic or other health record

## 2022-09-04 ENCOUNTER — Telehealth (INDEPENDENT_AMBULATORY_CARE_PROVIDER_SITE_OTHER): Payer: No Typology Code available for payment source | Admitting: Family Medicine

## 2022-09-04 ENCOUNTER — Encounter (INDEPENDENT_AMBULATORY_CARE_PROVIDER_SITE_OTHER): Payer: Self-pay | Admitting: Family Medicine

## 2022-09-04 DIAGNOSIS — Z6841 Body Mass Index (BMI) 40.0 and over, adult: Secondary | ICD-10-CM

## 2022-09-04 DIAGNOSIS — F3289 Other specified depressive episodes: Secondary | ICD-10-CM | POA: Diagnosis not present

## 2022-09-04 DIAGNOSIS — E669 Obesity, unspecified: Secondary | ICD-10-CM | POA: Diagnosis not present

## 2022-09-04 DIAGNOSIS — R7303 Prediabetes: Secondary | ICD-10-CM | POA: Diagnosis not present

## 2022-09-10 NOTE — Progress Notes (Signed)
Chief Complaint:   OBESITY Karen [name] is here to discuss her progress with her obesity treatment plan along with follow-up of her obesity related diagnoses. Karen [name] is on the Category 3 Plan or keeping a food journal and adhering to recommended goals of 1200-1400 calories and 90 grams of protein and states she is following her eating plan approximately 50% of the time. Karen [name] states she is doing 0 minutes 0 times per week.  Today's visit was #: 5 Starting weight: 224 lbs Starting date: 05/31/2022 Today's weight: 227 lbs Today's date: 08/23/2022 Total lbs lost to date: 0 Total lbs lost since last in-office visit: 0  Interim History: Karen [name] hasn't been following her plan well for various reasons such as her husband is not cooking healthy for her, and she has an upper respiratory infection. Her insurance will not cover a GLP-1. Her stress level is high and she hasn't prioritized her own health.   Subjective:   1. Other depression with emotional eating Karen [name] stopped Wellbutrin as she felt it didn't help, and it may have exacerbated her cough. She is still struggling with emotional eating behaviors. She denies a history of nephrolithiasis.   Assessment/Plan:   1. Other depression with emotional eating Karen [name] agreed to discontinue Wellbutrin, and start Topamax 25 mg qhs with no refill. Patient was warned of potential birth defects and she agreed to use birthcontrol faithfully. She will work on setting herself up for success.   - topiramate (TOPAMAX) 25 MG tablet; Take 1 tablet (25 mg total) by mouth at bedtime.  Dispense: 30 tablet; Refill: 0  2. Obesity with current BMI of 40.3 Karen [name] is currently in the action stage of change. As such, her goal is to continue with weight loss efforts. She has agreed to keeping a food journal and adhering to recommended goals of 1200-1400 calories and 90+ grams of protein daily.   Easy journaling strategies were discussed.   Behavioral modification strategies:  increasing lean protein intake and keeping a strict food journal.  Karen [name] has agreed to follow-up with our clinic in 3 weeks. She was informed of the importance of frequent follow-up visits to maximize her success with intensive lifestyle modifications for her multiple health conditions.   Objective:   Blood pressure 139/88, pulse 77, temperature 98.8 F (37.1 C), height 5\' 3"  (1.6 m), weight 227 lb 6.4 oz (103.1 kg), SpO2 97 %. Body mass index is 40.28 kg/m.  General: Cooperative, alert, well developed, in no acute distress. HEENT: Conjunctivae and lids unremarkable. Cardiovascular: Regular rhythm.  Lungs: Normal work of breathing. Neurologic: No focal deficits.   Lab Results  Component Value Date   CREATININE 0.67 05/18/2022   BUN 10 05/18/2022   NA 139 05/18/2022   K 4.4 05/18/2022   CL 107 05/18/2022   CO2 24 05/18/2022   Lab Results  Component Value Date   ALT 12 05/18/2022   AST 13 05/18/2022   ALKPHOS 73 12/27/2016   BILITOT 0.2 05/18/2022   Lab Results  Component Value Date   HGBA1C 6.0 (H) 05/31/2022   HGBA1C 5.2 11/13/2021   Lab Results  Component Value Date   INSULIN 20.7 05/31/2022   Lab Results  Component Value Date   TSH 3.610 05/31/2022   Lab Results  Component Value Date   CHOL 254 (H) 05/18/2022   HDL 60 05/18/2022   LDLCALC 166 (H) 05/18/2022   TRIG 142 05/18/2022   CHOLHDL 4.2 05/18/2022   Lab Results  Component Value Date  VD25OH 60 03/16/2014   Lab Results  Component Value Date   WBC 7.6 05/31/2022   HGB 15.7 05/31/2022   HCT 46.2 05/31/2022   MCV 87 05/31/2022   PLT 268 05/31/2022   No results found for: "IRON", "TIBC", "FERRITIN"  Attestation Statements:   Reviewed by clinician on day of visit: allergies, medications, problem list, medical history, surgical history, family history, social history, and previous encounter notes.  Time spent on visit including pre-visit chart review and post-visit care and charting was 30  minutes.   I, Trixie Dredge, am acting as transcriptionist for Dennard Nip, MD.  I have reviewed the above documentation for accuracy and completeness, and I agree with the above. -  Dennard Nip, MD

## 2022-09-17 ENCOUNTER — Other Ambulatory Visit (INDEPENDENT_AMBULATORY_CARE_PROVIDER_SITE_OTHER): Payer: Self-pay | Admitting: Family Medicine

## 2022-09-17 ENCOUNTER — Other Ambulatory Visit: Payer: Self-pay | Admitting: Family Medicine

## 2022-09-17 ENCOUNTER — Other Ambulatory Visit (HOSPITAL_COMMUNITY): Payer: Self-pay

## 2022-09-17 DIAGNOSIS — F3289 Other specified depressive episodes: Secondary | ICD-10-CM

## 2022-09-24 ENCOUNTER — Other Ambulatory Visit (INDEPENDENT_AMBULATORY_CARE_PROVIDER_SITE_OTHER): Payer: Self-pay | Admitting: Family Medicine

## 2022-09-24 DIAGNOSIS — F3289 Other specified depressive episodes: Secondary | ICD-10-CM

## 2022-09-29 ENCOUNTER — Other Ambulatory Visit (HOSPITAL_COMMUNITY): Payer: Self-pay

## 2022-10-02 ENCOUNTER — Encounter (INDEPENDENT_AMBULATORY_CARE_PROVIDER_SITE_OTHER): Payer: Self-pay | Admitting: Family Medicine

## 2022-10-02 ENCOUNTER — Ambulatory Visit (INDEPENDENT_AMBULATORY_CARE_PROVIDER_SITE_OTHER): Payer: No Typology Code available for payment source | Admitting: Family Medicine

## 2022-10-02 VITALS — BP 149/85 | HR 74 | Temp 98.2°F | Ht 63.0 in | Wt 228.0 lb

## 2022-10-02 DIAGNOSIS — E669 Obesity, unspecified: Secondary | ICD-10-CM

## 2022-10-02 DIAGNOSIS — Z6841 Body Mass Index (BMI) 40.0 and over, adult: Secondary | ICD-10-CM

## 2022-10-02 DIAGNOSIS — R030 Elevated blood-pressure reading, without diagnosis of hypertension: Secondary | ICD-10-CM | POA: Diagnosis not present

## 2022-10-02 DIAGNOSIS — F3289 Other specified depressive episodes: Secondary | ICD-10-CM | POA: Diagnosis not present

## 2022-10-02 MED ORDER — TOPIRAMATE 25 MG PO TABS
25.0000 mg | ORAL_TABLET | Freq: Two times a day (BID) | ORAL | 0 refills | Status: DC
  Filled 2022-10-02: qty 60, 30d supply, fill #0

## 2022-10-03 ENCOUNTER — Other Ambulatory Visit (HOSPITAL_COMMUNITY): Payer: Self-pay

## 2022-10-03 ENCOUNTER — Other Ambulatory Visit: Payer: Self-pay

## 2022-10-22 ENCOUNTER — Other Ambulatory Visit (HOSPITAL_COMMUNITY): Payer: Self-pay

## 2022-10-22 ENCOUNTER — Other Ambulatory Visit: Payer: Self-pay | Admitting: Family Medicine

## 2022-10-22 DIAGNOSIS — K219 Gastro-esophageal reflux disease without esophagitis: Secondary | ICD-10-CM

## 2022-10-23 NOTE — Progress Notes (Signed)
Chief Complaint:   OBESITY Karen [name] is here to discuss her progress with her obesity treatment plan along with follow-up of her obesity related diagnoses. Karen [name] is on the Category 2 Plan and states she is following her eating plan approximately 50% of the time. Karen [name] states she is doing 0 minutes 0 times per week.  Today's visit was #: 7 Starting weight: 224 lbs Starting date: 05/31/2022 Today's weight: 228 lbs Today's date: 10/02/2022 Total lbs lost to date: 0 Total lbs lost since last in-office visit: 0  Interim History: Karen [name] has done well with minimizing holiday weight gain. She is working on being mindful and portion controlling, but she is still struggling with meal planning.   Subjective:   1. Elevated blood pressure reading without diagnosis of hypertension Karen [name] notes some increased stress and pain recently.  She has no history of hypertension, and she denies chest pain.  2. Emotional Eating Behavior Karen [name] started Topamax and she is doing well, but she still notes emotional eating behaviors and cravings.  Assessment/Plan:   1. Elevated blood pressure reading without diagnosis of hypertension Karen [name] is to check her blood pressure at home 1-2 times per week, and we will discuss her home blood pressure readings at her next visit.  2. Emotional Eating Behavior Karen [name] agreed to increase Topamax to 25 mg twice daily, and we will refill for 1 month.  - topiramate (TOPAMAX) 25 MG tablet; Take 1 tablet (25 mg total) by mouth 2 (two) times daily.  Dispense: 60 tablet; Refill: 0  3. Obesity, Current BMI 40.5 Karen [name] is currently in the action stage of change. As such, her goal is to continue with weight loss efforts. She has agreed to the Category 2 Plan.   Behavioral modification strategies: increasing lean protein intake, meal planning and cooking strategies, emotional eating strategies, and holiday eating strategies .  Karen [name] has agreed to follow-up with our clinic in 4 weeks with [facility], FNP-C. She was informed of the importance of frequent follow-up visits to maximize her success with intensive lifestyle modifications for her multiple health conditions.   Objective:   Blood pressure (!) 149/85, pulse 74, temperature 98.2 F (36.8 C), height 5\' 3"  (1.6 m), weight 228 lb (103.4 kg), SpO2 99 %. Body mass index is 40.39 kg/m.  General: Cooperative, alert, well developed, in no acute distress. HEENT: Conjunctivae and lids unremarkable. Cardiovascular: Regular rhythm.  Lungs: Normal work of breathing. Neurologic: No focal deficits.   Lab Results  Component Value Date   CREATININE 0.67 05/18/2022   BUN 10 05/18/2022   NA 139 05/18/2022   K 4.4 05/18/2022   CL 107 05/18/2022   CO2 24 05/18/2022   Lab Results  Component Value Date   ALT 12 05/18/2022   AST 13 05/18/2022   ALKPHOS 73 12/27/2016   BILITOT 0.2 05/18/2022   Lab Results  Component Value Date   HGBA1C 6.0 (H) 05/31/2022   HGBA1C 5.2 11/13/2021   Lab Results  Component Value Date   INSULIN 20.7 05/31/2022   Lab Results  Component Value Date   TSH 3.610 05/31/2022   Lab Results  Component Value Date   CHOL 254 (H) 05/18/2022   HDL 60 05/18/2022   LDLCALC 166 (H) 05/18/2022   TRIG 142 05/18/2022   CHOLHDL 4.2 05/18/2022   Lab Results  Component Value Date   VD25OH 60 03/16/2014   Lab Results  Component Value Date   WBC 7.6 05/31/2022   HGB 15.7 05/31/2022  HCT 46.2 05/31/2022   MCV 87 05/31/2022   PLT 268 05/31/2022   No results found for: "IRON", "TIBC", "FERRITIN"  Attestation Statements:   Reviewed by clinician on day of visit: allergies, medications, problem list, medical history, surgical history, family history, social history, and previous encounter notes.   I, Trixie Dredge, am acting as transcriptionist for Dennard Nip, MD.  I have reviewed the above documentation for accuracy and completeness, and I agree with the above. -  Dennard Nip, MD

## 2022-10-23 NOTE — Telephone Encounter (Signed)
Requested medication (s) are due for refill today: yes  Requested medication (s) are on the active medication list: yes  Last refill:  10/01/21-10/14/98 #90 3 reflls  Future visit scheduled: no   Notes to clinic:  last ordered by Neil Crouch, PA 12/02/20. Do you want to order or refill Rx?     Requested Prescriptions  Pending Prescriptions Disp Refills   pantoprazole (PROTONIX) 40 MG tablet 90 tablet 3    Sig: TAKE 1 TABLET BY MOUTH DAILY BEFORE BREAKFAST.     Gastroenterology: Proton Pump Inhibitors Passed - 10/23/2022  8:31 AM      Passed - Valid encounter within last 12 months    Recent Outpatient Visits           11 months ago Prediabetes   [place] Dennard Schaumann, Cammie Mcgee, MD   1 year ago Chronic cough   [place] Dennard Schaumann, Cammie Mcgee, MD   1 year ago Other specified hypothyroidism   [place] Dennard Schaumann, Cammie Mcgee, MD   2 years ago General medical exam   [place] Susy Frizzle, MD   2 years ago Esophageal dysphagia   [place], Cammie Mcgee, MD       Future Appointments             In 1 week [place], Joneen Boers, [place]

## 2022-10-29 NOTE — Progress Notes (Unsigned)
TeleHealth Visit:  This visit was completed with telemedicine (audio/video) technology. [name] has verbally consented to this TeleHealth visit. The patient is located at home, the provider is located at home. The participants in this visit include the listed provider and patient. The visit was conducted today via MyChart video.  OBESITY Karen [name] is here to discuss her progress with her obesity treatment plan along with follow-up of her obesity related diagnoses.   Today's visit was # 8 Starting weight: 224 lbs Starting date: 05/31/2022 Weight at last in office visit: 228 lbs on 10/02/22 Total weight loss: 0 lbs at last in office visit on 10/02/22. Today's reported weight: *** lbs No weight reported.  Nutrition Plan: the Category 2 Plan.   Current exercise: {exercise types:16438} none  Interim History:  ***  Assessment/Plan:  1. ***  2. ***  3. ***  Obesity: Current BMI *** Karen [name] {CHL AMB IS/IS NOT:210130109} currently in the action stage of change. As such, her goal is to {MWMwtloss#1:210800005}.  She has agreed to {MWMwtlossportion/plan2:23431}.   Exercise goals: {MWM EXERCISE RECS:23473}  Behavioral modification strategies: {MWMwtlossdietstrategies3:23432}.  Karen [name] has agreed to follow-up with our clinic in {NUMBER 1-10:22536} weeks.   No orders of the defined types were placed in this encounter.   There are no discontinued medications.   No orders of the defined types were placed in this encounter.     Objective:   VITALS: Per patient if applicable, see vitals. GENERAL: Alert and in no acute distress. CARDIOPULMONARY: No increased WOB. Speaking in clear sentences.  PSYCH: Pleasant and cooperative. Speech normal rate and rhythm. Affect is appropriate. Insight and judgement are appropriate. Attention is focused, linear, and appropriate.  NEURO: Oriented as arrived to appointment on time with no prompting.   Lab Results  Component Value Date   CREATININE 0.67  05/18/2022   BUN 10 05/18/2022   NA 139 05/18/2022   K 4.4 05/18/2022   CL 107 05/18/2022   CO2 24 05/18/2022   Lab Results  Component Value Date   ALT 12 05/18/2022   AST 13 05/18/2022   ALKPHOS 73 12/27/2016   BILITOT 0.2 05/18/2022   Lab Results  Component Value Date   HGBA1C 6.0 (H) 05/31/2022   HGBA1C 5.2 11/13/2021   Lab Results  Component Value Date   INSULIN 20.7 05/31/2022   Lab Results  Component Value Date   TSH 3.610 05/31/2022   Lab Results  Component Value Date   CHOL 254 (H) 05/18/2022   HDL 60 05/18/2022   LDLCALC 166 (H) 05/18/2022   TRIG 142 05/18/2022   CHOLHDL 4.2 05/18/2022   Lab Results  Component Value Date   WBC 7.6 05/31/2022   HGB 15.7 05/31/2022   HCT 46.2 05/31/2022   MCV 87 05/31/2022   PLT 268 05/31/2022   No results found for: "IRON", "TIBC", "FERRITIN" Lab Results  Component Value Date   VD25OH 60 03/16/2014    Attestation Statements:   Reviewed by clinician on day of visit: allergies, medications, problem list, medical history, surgical history, family history, social history, and previous encounter notes.  ***(delete if time-based billing not used) Time spent on visit including the items listed below was *** minutes.  -preparing to see the patient (e.g., review of tests, history, previous notes) -obtaining and/or reviewing separately obtained history -counseling and educating the patient/family/caregiver -documenting clinical information in the electronic or other health record -ordering medications, tests, or procedures -independently interpreting results and communicating results to the patient/ family/caregiver -referring and communicating  with other health care professionals  -care coordination

## 2022-10-30 ENCOUNTER — Telehealth (INDEPENDENT_AMBULATORY_CARE_PROVIDER_SITE_OTHER): Payer: Self-pay | Admitting: Family Medicine

## 2022-10-30 ENCOUNTER — Other Ambulatory Visit: Payer: Self-pay

## 2022-10-30 ENCOUNTER — Encounter (INDEPENDENT_AMBULATORY_CARE_PROVIDER_SITE_OTHER): Payer: Self-pay | Admitting: *Deleted

## 2022-10-30 ENCOUNTER — Encounter (INDEPENDENT_AMBULATORY_CARE_PROVIDER_SITE_OTHER): Payer: Self-pay | Admitting: Family Medicine

## 2022-10-30 DIAGNOSIS — Z6839 Body mass index (BMI) 39.0-39.9, adult: Secondary | ICD-10-CM

## 2022-10-30 DIAGNOSIS — Z6841 Body Mass Index (BMI) 40.0 and over, adult: Secondary | ICD-10-CM

## 2022-10-30 DIAGNOSIS — R030 Elevated blood-pressure reading, without diagnosis of hypertension: Secondary | ICD-10-CM

## 2022-10-30 DIAGNOSIS — E669 Obesity, unspecified: Secondary | ICD-10-CM

## 2022-10-30 DIAGNOSIS — F3289 Other specified depressive episodes: Secondary | ICD-10-CM

## 2022-10-30 MED ORDER — ZEPBOUND 2.5 MG/0.5ML ~~LOC~~ SOAJ
2.5000 mg | SUBCUTANEOUS | 0 refills | Status: DC
  Filled 2022-10-30 – 2022-11-19 (×2): qty 2, 28d supply, fill #0

## 2022-10-31 ENCOUNTER — Other Ambulatory Visit (HOSPITAL_COMMUNITY): Payer: Self-pay

## 2022-10-31 ENCOUNTER — Other Ambulatory Visit: Payer: Self-pay

## 2022-10-31 DIAGNOSIS — E038 Other specified hypothyroidism: Secondary | ICD-10-CM

## 2022-10-31 DIAGNOSIS — R5383 Other fatigue: Secondary | ICD-10-CM

## 2022-10-31 DIAGNOSIS — K219 Gastro-esophageal reflux disease without esophagitis: Secondary | ICD-10-CM

## 2022-10-31 MED ORDER — PANTOPRAZOLE SODIUM 40 MG PO TBEC
40.0000 mg | DELAYED_RELEASE_TABLET | Freq: Every day | ORAL | 3 refills | Status: DC
  Filled 2022-10-31: qty 90, 90d supply, fill #0
  Filled 2023-01-24: qty 90, 90d supply, fill #1
  Filled 2023-04-26: qty 90, 90d supply, fill #2
  Filled 2023-10-28: qty 90, 90d supply, fill #3

## 2022-10-31 MED ORDER — LEVOTHYROXINE SODIUM 88 MCG PO TABS
88.0000 ug | ORAL_TABLET | Freq: Every day | ORAL | 3 refills | Status: DC
  Filled 2022-10-31: qty 90, 90d supply, fill #0
  Filled 2023-01-24: qty 90, 90d supply, fill #1
  Filled 2023-04-26: qty 90, 90d supply, fill #2
  Filled 2023-10-28: qty 90, 90d supply, fill #3

## 2022-11-14 ENCOUNTER — Encounter (HOSPITAL_COMMUNITY): Payer: Self-pay

## 2022-11-14 ENCOUNTER — Other Ambulatory Visit (HOSPITAL_COMMUNITY): Payer: Self-pay

## 2022-11-15 ENCOUNTER — Other Ambulatory Visit: Payer: Self-pay

## 2022-11-15 ENCOUNTER — Telehealth (INDEPENDENT_AMBULATORY_CARE_PROVIDER_SITE_OTHER): Payer: Self-pay | Admitting: Family Medicine

## 2022-11-15 NOTE — Telephone Encounter (Signed)
PA SUBMITTED VIA [employer]  [employer] (Key: [id number])  [employer] is reviewing your PA request. You may close this dialog, return to your dashboard, and perform other tasks.  To check for an update later, open this request again from your dashboard. If [employer] has not replied within 24 hours for urgent requests or within 48 hours for standard requests, please contact [employer] at [phone].

## 2022-11-19 ENCOUNTER — Other Ambulatory Visit (HOSPITAL_COMMUNITY): Payer: Self-pay

## 2022-11-19 ENCOUNTER — Other Ambulatory Visit: Payer: Self-pay

## 2022-11-19 NOTE — Telephone Encounter (Signed)
Approved on February 2 The request has been approved. The authorization is effective from 11/16/2022 to 05/15/2023, as long as the member is enrolled in their current health plan. The request was approved as submitted. This request has been approved for 52mL per 28 days.The following prior authorizations have also been entered effective 11/16/2022 through 05/15/2023: Zepbound 5mg /0.57mL, allowing 51mL per 28 days; please reference authorization number [id number]. Zepbound 7.5mg /0.11mL, allowing 23mL per 28 days; please reference authorization number [id number]. Zepbound 10mg /0.55mL, allowing 41mL per 28 days; please reference authorization number [id number]. Zepbound 12.5mg /0.15mL, allowing 90mL per 28 days; please reference authorization number [id number]. Zepbound 15mg /0.34mL, allowing 43mL per 28 days; please reference authorization number [id number]. A written notification letter will follow with additional details. Authorization Expiration Date: 05/14/2023

## 2022-11-27 ENCOUNTER — Encounter (INDEPENDENT_AMBULATORY_CARE_PROVIDER_SITE_OTHER): Payer: Self-pay | Admitting: Family Medicine

## 2022-11-27 ENCOUNTER — Ambulatory Visit (INDEPENDENT_AMBULATORY_CARE_PROVIDER_SITE_OTHER): Payer: 59 | Admitting: Family Medicine

## 2022-11-27 VITALS — BP 135/86 | HR 77 | Temp 98.2°F | Ht 63.0 in | Wt 224.0 lb

## 2022-11-27 DIAGNOSIS — Z6839 Body mass index (BMI) 39.0-39.9, adult: Secondary | ICD-10-CM

## 2022-11-27 DIAGNOSIS — Z6830 Body mass index (BMI) 30.0-30.9, adult: Secondary | ICD-10-CM | POA: Insufficient documentation

## 2022-11-27 DIAGNOSIS — R030 Elevated blood-pressure reading, without diagnosis of hypertension: Secondary | ICD-10-CM | POA: Diagnosis not present

## 2022-11-27 DIAGNOSIS — Z6833 Body mass index (BMI) 33.0-33.9, adult: Secondary | ICD-10-CM | POA: Insufficient documentation

## 2022-11-27 DIAGNOSIS — E669 Obesity, unspecified: Secondary | ICD-10-CM | POA: Diagnosis not present

## 2022-11-27 DIAGNOSIS — Z6831 Body mass index (BMI) 31.0-31.9, adult: Secondary | ICD-10-CM | POA: Insufficient documentation

## 2022-11-28 MED ORDER — ZEPBOUND 5 MG/0.5ML ~~LOC~~ SOAJ
5.0000 mg | SUBCUTANEOUS | 0 refills | Status: DC
  Filled 2022-11-28: qty 2, 28d supply, fill #0

## 2022-11-29 ENCOUNTER — Other Ambulatory Visit: Payer: Self-pay

## 2022-11-29 ENCOUNTER — Other Ambulatory Visit (HOSPITAL_COMMUNITY): Payer: Self-pay

## 2022-12-11 NOTE — Progress Notes (Unsigned)
Chief Complaint:   OBESITY Karen [name] is here to discuss her progress with her obesity treatment plan along with follow-up of her obesity related diagnoses. Karen [name] is on the Category 2 Plan and states she is following her eating plan approximately 100% of the time. Karen [name] states she is doing 0 minutes 0 times per week.  Today's visit was #: 9 Starting weight: 224 lbs Starting date: 05/31/2022 Today's weight: 224 lbs Today's date: 11/27/2022 Total lbs lost to date: 0 Total lbs lost since last in-office visit: 4  Interim History: Karen [name] has done well with her weight loss. She started Zepbound and she feels it is helping. She has been on GLP-1 medications in the past and she has been on higher doses. She would liked to increase when she finishes this month's dose.   Subjective:   1. Pre-hypertension Karen [name]'s blood pressure improved from her last time. She is not on medications. She is doing well with her diet and weight loss, which is likely helping.   Assessment/Plan:   1. Pre-hypertension Karen [name] will continue with her diet, exercise, and weight loss and we will recheck her blood pressure in 4 weeks.   2. BMI 39.0-39.9,adult  - tirzepatide (ZEPBOUND) 5 MG/0.5ML Pen; Inject 5 mg into the skin once a week.  Dispense: 2 mL; Refill: 0  3. Obesity, Beginning BMI 39.68 Karen [name] agreed to increase Zepbound to 5 mg once weekly, and we will refill for 1 month.   - tirzepatide (ZEPBOUND) 5 MG/0.5ML Pen; Inject 5 mg into the skin once a week.  Dispense: 2 mL; Refill: 0  Karen [name] is currently in the action stage of change. As such, her goal is to continue with weight loss efforts. She has agreed to the Category 2 Plan.   Behavioral modification strategies: increasing lean protein intake.  Karen [name] has agreed to follow-up with our clinic in 4 weeks. She was informed of the importance of frequent follow-up visits to maximize her success with intensive lifestyle modifications for her multiple health conditions.    Objective:   Blood pressure 135/86, pulse 77, temperature 98.2 F (36.8 C), height '5\' 3"'$  (1.6 m), weight 224 lb (101.6 kg), SpO2 97 %. Body mass index is 39.68 kg/m.  General: Cooperative, alert, well developed, in no acute distress. HEENT: Conjunctivae and lids unremarkable. Cardiovascular: Regular rhythm.  Lungs: Normal work of breathing. Neurologic: No focal deficits.   Lab Results  Component Value Date   CREATININE 0.67 05/18/2022   BUN 10 05/18/2022   NA 139 05/18/2022   K 4.4 05/18/2022   CL 107 05/18/2022   CO2 24 05/18/2022   Lab Results  Component Value Date   ALT 12 05/18/2022   AST 13 05/18/2022   ALKPHOS 73 12/27/2016   BILITOT 0.2 05/18/2022   Lab Results  Component Value Date   HGBA1C 6.0 (H) 05/31/2022   HGBA1C 5.2 11/13/2021   Lab Results  Component Value Date   INSULIN 20.7 05/31/2022   Lab Results  Component Value Date   TSH 3.610 05/31/2022   Lab Results  Component Value Date   CHOL 254 (H) 05/18/2022   HDL 60 05/18/2022   LDLCALC 166 (H) 05/18/2022   TRIG 142 05/18/2022   CHOLHDL 4.2 05/18/2022   Lab Results  Component Value Date   VD25OH 60 03/16/2014   Lab Results  Component Value Date   WBC 7.6 05/31/2022   HGB 15.7 05/31/2022   HCT 46.2 05/31/2022   MCV 87 05/31/2022   PLT  268 05/31/2022   No results found for: "IRON", "TIBC", "FERRITIN"  Attestation Statements:   Reviewed by clinician on day of visit: allergies, medications, problem list, medical history, surgical history, family history, social history, and previous encounter notes.   I, Trixie Dredge, am acting as transcriptionist for Dennard Nip, MD.  I have reviewed the above documentation for accuracy and completeness, and I agree with the above. -  ***

## 2022-12-24 NOTE — Progress Notes (Signed)
TeleHealth Visit:  This visit was completed with telemedicine (audio/video) technology. Karen [name] has verbally consented to this TeleHealth visit. The patient is located at home, the provider is located at home. The participants in this visit include the listed provider and patient. The visit was conducted today via MyChart video.  OBESITY Karen [name] is here to discuss her progress with her obesity treatment plan along with follow-up of her obesity related diagnoses.   Today's visit was # 10 Starting weight: 224 lbs Starting date: 05/31/2022 Weight at last in office visit: 224 lbs on 11/27/22 Total weight loss: 0 lbs at last in office visit on 11/27/22. Today's reported weight: 216  lbs   Nutrition Plan: the Category 2 plan   Current exercise:  Using dumb bells three times per week.   Interim History:  Has lost 8 lbs since last visit. Zepbound has decreased appetite tremendously. BP has improved and she feels better.  She plans to work into doing some cardio eventually.  She is eating protein first but does not always get it all in due to satiety from Zepbound.   She is quite upset because her employer Karen [name] will stop covering this medication as of  April 1. She would like to schedule an appointment for March 28 to see if she can get 1 more refill.  Pharmacotherapy: Karen [name] is on Zepbound 5.0 mg SQ weekly Adverse side effects: Diarrhea, nausea, acid reflux (has improved slowly) Hunger is well controlled.  Cravings are well controlled.   Has also been on some off label medications for appetite/cravings such as bupropion and topiramate with minimal effect. Failed Saxenda.  Some appetite suppression initially which wore off. She was on Ozempic and noticed good appetite suppression and satiety but coverage was discontinued because she did not have type 2 diabetes. Coverage for [employer] has been denied in the past.   Started Zepbound 2.5 mg on 11/19/22.   Assessment/Plan:  1.  Gastroesophageal reflux disease without esophagitis Symptoms are not well-controlled since starting Zepbound.  She takes pantoprazole sporadically.  She is concerned about osteoporosis. Medication: Pantoprazole 40 mg daily before breakfast.  Plan: Start famotidine 20 mg twice daily as needed for symptoms. Use antacids such as Tums or Mylanta as needed. May use OTC Pepcid along with PPI if needed. Avoid trigger foods (spicy foods, fatty/fried foods, acidic foods, coffee, alcohol). Avoid eating within 3 hours of bedtime. Continue to work on weight loss.  2.  Elevated blood pressure without diagnosis of hypertension Blood pressures have been improved since starting Zepbound.  Blood pressure was 149/85 on 10/02/2022.  On 11/27/2022-blood pressure was 135/86.  She is not taking medications.  BP Readings from Last 3 Encounters:  11/27/22 135/86  10/02/22 (!) 149/85  08/23/22 139/88     Plan: Continue weight loss. Add cardio to exercise regimen.  3. Generalized Obesity: Current BMI 39 Pharmacotherapy Plan Continue and increase dose  Zepbound 7.5 mg SQ weekly Karen [name] is currently in the action stage of change. As such, her goal is to continue with weight loss efforts.  She has agreed to the Category 2 plan.  1.  Continue to eat protein first. 2.  Use protein equivalent sheet to add protein.  Exercise goals: Continue resistance training.  Add cardio.  Behavioral modification strategies: increasing lean protein intake, increase water intake, and planning for success.  Karen [name] has agreed to follow-up with our clinic in 2-3 weeks.   No orders of the defined types were placed in this encounter.   Medications  Discontinued During This Encounter  Medication Reason   tirzepatide (ZEPBOUND) 2.5 MG/0.5ML Pen    tirzepatide (ZEPBOUND) 5 MG/0.5ML Pen      Meds ordered this encounter  Medications   tirzepatide (ZEPBOUND) 7.5 MG/0.5ML Pen    Sig: Inject 7.5 mg into the skin once a week.     Dispense:  2 mL    Refill:  0    Order Specific Question:   Supervising Provider    Answer:   Dell Ponto [[id number]]      Objective:   VITALS: Per patient if applicable, see vitals. GENERAL: Alert and in no acute distress. CARDIOPULMONARY: No increased WOB. Speaking in clear sentences.  PSYCH: Pleasant and cooperative. Speech normal rate and rhythm. Affect is appropriate. Insight and judgement are appropriate. Attention is focused, linear, and appropriate.  NEURO: Oriented as arrived to appointment on time with no prompting.   Attestation Statements:   Reviewed by clinician on day of visit: allergies, medications, problem list, medical history, surgical history, family history, social history, and previous encounter notes.   This was prepared with the assistance of Presenter, broadcasting.  Occasional wrong-word or sound-a-like substitutions may have occurred due to the inherent limitations of voice recognition software.

## 2022-12-25 ENCOUNTER — Encounter (INDEPENDENT_AMBULATORY_CARE_PROVIDER_SITE_OTHER): Payer: Self-pay | Admitting: Family Medicine

## 2022-12-25 ENCOUNTER — Telehealth (INDEPENDENT_AMBULATORY_CARE_PROVIDER_SITE_OTHER): Payer: 59 | Admitting: Family Medicine

## 2022-12-25 ENCOUNTER — Other Ambulatory Visit (HOSPITAL_COMMUNITY): Payer: Self-pay

## 2022-12-25 ENCOUNTER — Other Ambulatory Visit: Payer: Self-pay

## 2022-12-25 DIAGNOSIS — R030 Elevated blood-pressure reading, without diagnosis of hypertension: Secondary | ICD-10-CM | POA: Diagnosis not present

## 2022-12-25 DIAGNOSIS — K219 Gastro-esophageal reflux disease without esophagitis: Secondary | ICD-10-CM | POA: Diagnosis not present

## 2022-12-25 DIAGNOSIS — E669 Obesity, unspecified: Secondary | ICD-10-CM | POA: Diagnosis not present

## 2022-12-25 DIAGNOSIS — Z6839 Body mass index (BMI) 39.0-39.9, adult: Secondary | ICD-10-CM

## 2022-12-25 MED ORDER — ZEPBOUND 7.5 MG/0.5ML ~~LOC~~ SOAJ
7.5000 mg | SUBCUTANEOUS | 0 refills | Status: DC
  Filled 2022-12-25 (×2): qty 2, 28d supply, fill #0

## 2023-01-09 NOTE — Progress Notes (Signed)
  TeleHealth Visit:  This visit was completed with telemedicine (audio/video) technology. Ninah has verbally consented to this TeleHealth visit. The patient is located at home, the provider is located at home. The participants in this visit include the listed provider and patient. The visit was conducted today via MyChart video.  OBESITY Jaemarie is here to discuss her progress with her obesity treatment plan along with follow-up of her obesity related diagnoses.   Today's visit was # 11 Starting weight: 224 lbs Starting date: 05/31/2022 Weight at last in office visit: 224 lbs on 11/27/22 Total weight loss: 0 lbs at last in office visit on 11/27/22. Today's reported weight: {dwwweightreported:29243}   Nutrition Plan: the Category 2 plan - ***% adherence.  Current exercise: {exercise types:16438} Using dumb bells three times per week.   Interim History:  *** She is struggling with {dwwstruggling:29335}.  She is working on {dwwworkingon:29334}  Pharmacotherapy: Dalery is on {dwwpharmacotherapy:29109} Adverse side effects: {dwwse:29122} Hunger is {EWCONTROLASSESSMENT:24261}.  Cravings are {EWCONTROLASSESSMENT:24261}.  Assessment/Plan:  1. ***  2. ***  3. ***  {dwwmorbid:29108::"Morbid Obesity"}: Current BMI *** Pharmacotherapy Plan {dwwmed:29123}  {dwwpharmacotherapy:29109} Bassheva {CHL AMB IS/IS NOT:210130109} currently in the action stage of change. As such, her goal is to {MWMwtloss#1:210800005}.  She has agreed to {dwwsldiets:29085}.  Exercise goals: {MWM EXERCISE RECS:23473}  Behavioral modification strategies: {dwwslwtlossstrategies:29088}.  Brinda has agreed to follow-up with our clinic in {NUMBER 1-10:22536} weeks.   No orders of the defined types were placed in this encounter.   There are no discontinued medications.   No orders of the defined types were placed in this encounter.     Objective:   VITALS: Per patient if applicable, see vitals. GENERAL: Alert  and in no acute distress. CARDIOPULMONARY: No increased WOB. Speaking in clear sentences.  PSYCH: Pleasant and cooperative. Speech normal rate and rhythm. Affect is appropriate. Insight and judgement are appropriate. Attention is focused, linear, and appropriate.  NEURO: Oriented as arrived to appointment on time with no prompting.   Attestation Statements:   Reviewed by clinician on day of visit: allergies, medications, problem list, medical history, surgical history, family history, social history, and previous encounter notes.  ***(delete if time-based billing not used) Time spent on visit including the items listed below was *** minutes.  -preparing to see the patient (e.g., review of tests, history, previous notes) -obtaining and/or reviewing separately obtained history -counseling and educating the patient/family/caregiver -documenting clinical information in the electronic or other health record -ordering medications, tests, or procedures -independently interpreting results and communicating results to the patient/ family/caregiver -referring and communicating with other health care professionals  -care coordination   This was prepared with the assistance of Presenter, broadcasting.  Occasional wrong-word or sound-a-like substitutions may have occurred due to the inherent limitations of voice recognition software.

## 2023-01-10 ENCOUNTER — Other Ambulatory Visit: Payer: Self-pay

## 2023-01-10 ENCOUNTER — Telehealth (INDEPENDENT_AMBULATORY_CARE_PROVIDER_SITE_OTHER): Payer: 59 | Admitting: Family Medicine

## 2023-01-10 ENCOUNTER — Encounter (INDEPENDENT_AMBULATORY_CARE_PROVIDER_SITE_OTHER): Payer: Self-pay | Admitting: Family Medicine

## 2023-01-10 DIAGNOSIS — Z6839 Body mass index (BMI) 39.0-39.9, adult: Secondary | ICD-10-CM

## 2023-01-10 DIAGNOSIS — R7303 Prediabetes: Secondary | ICD-10-CM

## 2023-01-10 DIAGNOSIS — E669 Obesity, unspecified: Secondary | ICD-10-CM | POA: Insufficient documentation

## 2023-01-10 DIAGNOSIS — K219 Gastro-esophageal reflux disease without esophagitis: Secondary | ICD-10-CM

## 2023-01-10 MED ORDER — ZEPBOUND 10 MG/0.5ML ~~LOC~~ SOAJ
10.0000 mg | SUBCUTANEOUS | 0 refills | Status: DC
  Filled 2023-01-10 – 2023-01-17 (×3): qty 2, 28d supply, fill #0

## 2023-01-11 ENCOUNTER — Other Ambulatory Visit (HOSPITAL_COMMUNITY): Payer: Self-pay

## 2023-01-14 ENCOUNTER — Other Ambulatory Visit (HOSPITAL_COMMUNITY): Payer: Self-pay

## 2023-01-17 ENCOUNTER — Other Ambulatory Visit: Payer: Self-pay

## 2023-01-17 ENCOUNTER — Other Ambulatory Visit (HOSPITAL_COMMUNITY): Payer: Self-pay

## 2023-01-22 ENCOUNTER — Ambulatory Visit (INDEPENDENT_AMBULATORY_CARE_PROVIDER_SITE_OTHER): Payer: 59 | Admitting: Family Medicine

## 2023-01-24 ENCOUNTER — Other Ambulatory Visit (HOSPITAL_COMMUNITY): Payer: Self-pay

## 2023-01-31 ENCOUNTER — Encounter (INDEPENDENT_AMBULATORY_CARE_PROVIDER_SITE_OTHER): Payer: Self-pay | Admitting: Family Medicine

## 2023-01-31 ENCOUNTER — Other Ambulatory Visit (HOSPITAL_COMMUNITY): Payer: Self-pay

## 2023-01-31 ENCOUNTER — Ambulatory Visit (INDEPENDENT_AMBULATORY_CARE_PROVIDER_SITE_OTHER): Payer: 59 | Admitting: Family Medicine

## 2023-01-31 VITALS — BP 135/82 | HR 81 | Temp 97.9°F | Ht 63.0 in | Wt 206.0 lb

## 2023-01-31 DIAGNOSIS — Z6836 Body mass index (BMI) 36.0-36.9, adult: Secondary | ICD-10-CM

## 2023-01-31 DIAGNOSIS — M6284 Sarcopenia: Secondary | ICD-10-CM | POA: Diagnosis not present

## 2023-01-31 DIAGNOSIS — R632 Polyphagia: Secondary | ICD-10-CM

## 2023-01-31 DIAGNOSIS — E669 Obesity, unspecified: Secondary | ICD-10-CM

## 2023-01-31 MED ORDER — ZEPBOUND 7.5 MG/0.5ML ~~LOC~~ SOAJ
7.5000 mg | SUBCUTANEOUS | 0 refills | Status: DC
  Filled 2023-01-31: qty 2, 28d supply, fill #0

## 2023-02-01 ENCOUNTER — Other Ambulatory Visit (HOSPITAL_COMMUNITY): Payer: Self-pay

## 2023-02-01 ENCOUNTER — Other Ambulatory Visit: Payer: Self-pay

## 2023-02-05 NOTE — Progress Notes (Signed)
Chief Complaint:   OBESITY Karen [name] is here to discuss her progress with her obesity treatment plan along with follow-up of her obesity related diagnoses. [name] is on the Category 2 Plan and states she is following her eating plan approximately 50% of the time. Karen [name] states she is using dumb bells 2 times per week.   Today's visit was #: 12 Starting weight: 224 lbs Starting date: 05/31/2022 Today's weight: 206 lbs Today's date: 01/31/2023 Total lbs lost to date: 18 Total lbs lost since last in-office visit: 18  Interim History: Karen [name] is working on portion control and [employer], and she is trying to increase her lean protein and doing more seafood.  She is working on increasing her upper body strength training.  Subjective:   1. Polyphagia [name] is on Zepbound, and she is doing well with her weight loss.  Her polyphagia has greatly reduced.  She may not be eating enough nutrition.  2. Sarcopenia Karen [name] is working on protein and increasing strengthening, but her muscle mass is diminishing and this could cause health issues and decrease her RMR unless it is resolved.  Assessment/Plan:   1. Polyphagia [name] agreed to decrease Zepbound to 7.5 mg once weekly, and we will refill for 1 month.  While she stays on GLP-1 in the meanwhile, may consider starting metformin.  - tirzepatide (ZEPBOUND) 7.5 MG/0.5ML Pen; Inject 7.5 mg into the skin once a week.  Dispense: 2 mL; Refill: 0  2. Sarcopenia [name] was encouraged to eat 1200 kcals and increase her protein to at least 75 g daily.  We will follow-up in 4 to 6 weeks to reassess.  3. BMI 36.0-36.9,adult  4. Obesity, Beginning BMI 39.68 Karen [name] is currently in the action stage of change. As such, her goal is to continue with weight loss efforts. She has agreed to keeping a food journal and adhering to recommended goals of 1200 calories and 75+ grams of protein daily.   Exercise goals: Continue to increase large muscle strength  training.  Behavioral modification strategies: increasing lean protein intake and meal planning and cooking strategies.  Karen [name] has agreed to follow-up with our clinic in 4 weeks. She was informed of the importance of frequent follow-up visits to maximize her success with intensive lifestyle modifications for her multiple health conditions.   Objective:   Blood pressure 135/82, pulse 81, temperature 97.9 F (36.6 C), height  (1.6 m), weight 206 lb (93.4 kg), SpO2 97 %. Body mass index is 36.49 kg/m.  Lab Results  Component Value Date   CREATININE 0.67 05/18/2022   BUN 10 05/18/2022   NA 139 05/18/2022   K 4.4 05/18/2022   CL 107 05/18/2022   CO2 24 05/18/2022   Lab Results  Component Value Date   ALT 12 05/18/2022   AST 13 05/18/2022   ALKPHOS 73 12/27/2016   BILITOT 0.2 05/18/2022   Lab Results  Component Value Date   HGBA1C 6.0 (H) 05/31/2022   HGBA1C 5.2 11/13/2021   Lab Results  Component Value Date   INSULIN 20.7 05/31/2022   Lab Results  Component Value Date   TSH 3.610 05/31/2022   Lab Results  Component Value Date   CHOL 254 (H) 05/18/2022   HDL 60 05/18/2022   LDLCALC 166 (H) 05/18/2022   TRIG 142 05/18/2022   CHOLHDL 4.2 05/18/2022   Lab Results  Component Value Date   VD25OH 60 03/16/2014   Lab Results  Component Value Date  WBC 7.6 05/31/2022   HGB 15.7 05/31/2022   HCT 46.2 05/31/2022   MCV 87 05/31/2022   PLT 268 05/31/2022   No results found for: "IRON", "TIBC", "FERRITIN"  Attestation Statements:   Reviewed by clinician on day of visit: allergies, medications, problem list, medical history, surgical history, family history, social history, and previous encounter notes.   I, Burt Knack, am acting as transcriptionist for Quillian Quince, MD.  I have reviewed the above documentation for accuracy and completeness, and I agree with the above. -  Quillian Quince, MD

## 2023-02-27 NOTE — Progress Notes (Deleted)
  TeleHealth Visit:  This visit was completed with telemedicine (audio/video) technology. Maziyah has verbally consented to this TeleHealth visit. The patient is located at home, the provider is located at home. The participants in this visit include the listed provider and patient. The visit was conducted today via MyChart video.  OBESITY Jahmila is here to discuss her progress with her obesity treatment plan along with follow-up of her obesity related diagnoses.   Today's visit was # 13 Starting weight: 224 lbs Starting date: 05/31/2022 Weight at last in office visit: 206 lbs on 01/31/23 Total weight loss: 18 lbs at last in office visit on 01/31/23. Today's reported weight (***): {dwwweightreported:29243}  Nutrition Plan: keeping a food journal with goal of 1200 calories and 75 grams of protein daily - ***% adherence.  Current exercise: {exercise types:16438} using dumb bells 2 times per week  Interim History:  ***  Eating all of the prescribed protein: {yes***/no:17258} Skipping meals: {dwwyes:29172} Drinking adequate water: {dwwyes:29172} Drinking sugar sweetened beverages: {dwwyes:29172} Hunger controlled: {EWCONTROLASSESSMENT:24261}. Cravings controlled:  {EWCONTROLASSESSMENT:24261}.  Journaling Consistently:  {dwwyes:29172} Meeting protein goals:  {dwwyes:29172} Meeting calorie goals:  {dwwyes:29172}   Pharmacotherapy: Kenra is on {dwwpharmacotherapy:29109} Adverse side effects: {dwwse:29122} Hunger is {EWCONTROLASSESSMENT:24261}.  Cravings are {EWCONTROLASSESSMENT:24261}.  Assessment/Plan:  1. ***  2. ***  3. ***  {dwwmorbid:29108::"Morbid Obesity"}: Current BMI ***  Pharmacotherapy Plan {dwwmed:29123}  {dwwpharmacotherapy:29109}  Malai {CHL AMB IS/IS NOT:210130109} currently in the action stage of change. As such, her goal is to {MWMwtloss#1:210800005}.  She has agreed to {dwwsldiets:29085}.  Exercise goals: {MWM EXERCISE RECS:23473}  Behavioral  modification strategies: {dwwslwtlossstrategies:29088}.  Bernard has agreed to follow-up with our clinic in {NUMBER 1-10:22536} weeks.   No orders of the defined types were placed in this encounter.   There are no discontinued medications.   No orders of the defined types were placed in this encounter.     Objective:   VITALS: Per patient if applicable, see vitals. GENERAL: Alert and in no acute distress. CARDIOPULMONARY: No increased WOB. Speaking in clear sentences.  PSYCH: Pleasant and cooperative. Speech normal rate and rhythm. Affect is appropriate. Insight and judgement are appropriate. Attention is focused, linear, and appropriate.  NEURO: Oriented as arrived to appointment on time with no prompting.   Attestation Statements:   Reviewed by clinician on day of visit: allergies, medications, problem list, medical history, surgical history, family history, social history, and previous encounter notes.  ***(delete if time-based billing not used) Time spent on visit including the items listed below was *** minutes.  -preparing to see the patient (e.g., review of tests, history, previous notes) -obtaining and/or reviewing separately obtained history -counseling and educating the patient/family/caregiver -documenting clinical information in the electronic or other health record -ordering medications, tests, or procedures -independently interpreting results and communicating results to the patient/ family/caregiver -referring and communicating with other health care professionals  -care coordination   This was prepared with the assistance of Engineer, civil (consulting).  Occasional wrong-word or sound-a-like substitutions may have occurred due to the inherent limitations of voice recognition software.

## 2023-02-28 ENCOUNTER — Telehealth (INDEPENDENT_AMBULATORY_CARE_PROVIDER_SITE_OTHER): Payer: 59 | Admitting: Family Medicine

## 2023-03-04 NOTE — Progress Notes (Unsigned)
TeleHealth Visit:  This visit was completed with telemedicine (audio/video) technology. Karen [name] has verbally consented to this TeleHealth visit. The patient is located at home, the provider is located at home. The participants in this visit include the listed provider and patient. The visit was conducted today via MyChart video.  OBESITY Karen [name] is here to discuss her progress with her obesity treatment plan along with follow-up of her obesity related diagnoses.   Today's visit was # 13 Starting weight: 224 lbs Starting date: 05/31/2022 Weight at last in office visit: 206 lbs on 01/31/23 Total weight loss: 18 lbs at last in office visit on 01/31/23. Today's reported weight (03/05/23): none reported  Nutrition Plan: keeping a food journal with goal of 1200 calories and 75+ grams of protein daily - 0% adherence.  Current exercise:  Squats (count of 50) 3 days per week for 5 minutes.  Interim History:  She is taking Zepbound every 2 weeks to conserve it. She has 8 weeks left of the Zepbound. She no longer has insurance coverage for [employer].  Bioimpedance at last OV revealed loss of 6 lbs of muscle mass. She is not tracking calories and protein. Lacks appetite.  Skips 2 meals per day.  Typical day of food: Breakfast- skips after working nights.  Cannot lie down after eating due to GERD. Lunch- eats meal at midnight (8 oz of meat) Dinner: skips Snacks- jello, nuts, fruit   Pharmacotherapy: Karen [name] is on Mounjaro 7.5 mg SQ weekly Adverse side effects: None Hunger is well controlled.  Cravings are well controlled.  Assessment/Plan:  1. Prediabetes Last A1c was 6.0   Medication(s): Mounjaro 7.5 mg SQ weekly-taking every 14 days. Polyphagia:No Lab Results  Component Value Date   HGBA1C 6.0 (H) 05/31/2022   HGBA1C 5.2 11/13/2021   Lab Results  Component Value Date   INSULIN 20.7 05/31/2022    Plan: Continue Zepbound 7.5 mg SQ weekly-take every 14 days   2.  Sarcopenia Bioimpedance at last OV revealed loss of 6 lbs of muscle mass. She is still protein deficient and skipping meals.  Plan: Continue squats.  Add abdominal exercises.  Get resistance band at next office visit to add in some arm exercises. Focus on 75 g of protein daily.  3. Morbid Obesity: Current BMI 36  Pharmacotherapy Plan Continue  Zepbound 7.5 mg SQ weekly-take every 14 days  Karen [name] is currently in the action stage of change. As such, her goal is to continue with weight loss efforts.  She has agreed to practicing portion control and making smarter food choices, such as increasing vegetables and decreasing simple carbohydrates and Other 75 gms protein day. .  1. Add meal before going to work 2. Add protein snacks. 3. 75 gms per day of protein per day. 4.  Goal is 2 meals with protein and 1 protein shake daily.  Exercise goals: Continue squats. Add leg lifts  Behavioral modification strategies: increasing lean protein intake and no meal skipping.  Karen [name] has agreed to follow-up with our clinic in 4 weeks.   No orders of the defined types were placed in this encounter.   There are no discontinued medications.   No orders of the defined types were placed in this encounter.     Objective:   VITALS: Per patient if applicable, see vitals. GENERAL: Alert and in no acute distress. CARDIOPULMONARY: No increased WOB. Speaking in clear sentences.  PSYCH: Pleasant and cooperative. Speech normal rate and rhythm. Affect is appropriate. Insight and judgement are appropriate. Attention  is focused, linear, and appropriate.  NEURO: Oriented as arrived to appointment on time with no prompting.   Attestation Statements:   Reviewed by clinician on day of visit: allergies, medications, problem list, medical history, surgical history, family history, social history, and previous encounter notes.  Time spent on visit including the items listed below was 30 minutes.  -preparing to see  the patient (e.g., review of tests, history, previous notes) -obtaining and/or reviewing separately obtained history -counseling and educating the patient/family/caregiver -documenting clinical information in the electronic or other health record -ordering medications, tests, or procedures -independently interpreting results and communicating results to the patient/ family/caregiver -referring and communicating with other health care professionals  -care coordination   This was prepared with the assistance of Engineer, civil (consulting).  Occasional wrong-word or sound-a-like substitutions may have occurred due to the inherent limitations of voice recognition software.

## 2023-03-05 ENCOUNTER — Telehealth (INDEPENDENT_AMBULATORY_CARE_PROVIDER_SITE_OTHER): Payer: 59 | Admitting: Family Medicine

## 2023-03-05 ENCOUNTER — Encounter (INDEPENDENT_AMBULATORY_CARE_PROVIDER_SITE_OTHER): Payer: Self-pay | Admitting: Family Medicine

## 2023-03-05 DIAGNOSIS — E669 Obesity, unspecified: Secondary | ICD-10-CM

## 2023-03-05 DIAGNOSIS — R7303 Prediabetes: Secondary | ICD-10-CM | POA: Diagnosis not present

## 2023-03-05 DIAGNOSIS — M6284 Sarcopenia: Secondary | ICD-10-CM | POA: Diagnosis not present

## 2023-03-05 DIAGNOSIS — Z6836 Body mass index (BMI) 36.0-36.9, adult: Secondary | ICD-10-CM | POA: Diagnosis not present

## 2023-03-06 ENCOUNTER — Other Ambulatory Visit (HOSPITAL_COMMUNITY): Payer: Self-pay

## 2023-03-07 ENCOUNTER — Other Ambulatory Visit: Payer: 59

## 2023-03-07 DIAGNOSIS — E7849 Other hyperlipidemia: Secondary | ICD-10-CM

## 2023-03-07 DIAGNOSIS — E559 Vitamin D deficiency, unspecified: Secondary | ICD-10-CM

## 2023-03-07 DIAGNOSIS — R030 Elevated blood-pressure reading, without diagnosis of hypertension: Secondary | ICD-10-CM

## 2023-03-07 DIAGNOSIS — E88819 Insulin resistance, unspecified: Secondary | ICD-10-CM | POA: Diagnosis not present

## 2023-03-07 DIAGNOSIS — R5383 Other fatigue: Secondary | ICD-10-CM | POA: Diagnosis not present

## 2023-03-07 DIAGNOSIS — R7303 Prediabetes: Secondary | ICD-10-CM

## 2023-03-07 DIAGNOSIS — Z6839 Body mass index (BMI) 39.0-39.9, adult: Secondary | ICD-10-CM | POA: Diagnosis not present

## 2023-03-08 LAB — LIPID PANEL
Cholesterol: 214 mg/dL — ABNORMAL HIGH (ref <200)
HDL: 46 mg/dL — ABNORMAL LOW (ref >=50)
LDL Cholesterol (Calc): 146 mg/dL (calc) — ABNORMAL HIGH
Non-HDL Cholesterol (Calc): 168 mg/dL (calc) — ABNORMAL HIGH (ref <130)
Total CHOL/HDL Ratio: 4.7 (calc) (ref <5.0)
Triglycerides: 105 mg/dL (ref <150)

## 2023-03-08 LAB — TSH: TSH: 2.55 mIU/L

## 2023-03-08 LAB — COMPLETE METABOLIC PANEL WITH GFR
AG Ratio: 1.6 (calc) (ref 1.0–2.5)
ALT: 8 U/L (ref 6–29)
AST: 10 U/L (ref 10–35)
Albumin: 4.1 g/dL (ref 3.6–5.1)
Alkaline phosphatase (APISO): 55 U/L (ref 31–125)
BUN: 8 mg/dL (ref 7–25)
CO2: 19 mmol/L — ABNORMAL LOW (ref 20–32)
Calcium: 9.3 mg/dL (ref 8.6–10.2)
Chloride: 110 mmol/L (ref 98–110)
Creat: 0.7 mg/dL (ref 0.50–0.99)
Globulin: 2.5 g/dL (calc) (ref 1.9–3.7)
Glucose, Bld: 91 mg/dL (ref 65–99)
Potassium: 3.7 mmol/L (ref 3.5–5.3)
Sodium: 141 mmol/L (ref 135–146)
Total Bilirubin: 0.2 mg/dL (ref 0.2–1.2)
Total Protein: 6.6 g/dL (ref 6.1–8.1)
eGFR: 107 mL/min/{1.73_m2} (ref >=60)

## 2023-03-08 LAB — HEMOGLOBIN A1C
Hgb A1c MFr Bld: 5.8 % of total Hgb — ABNORMAL HIGH (ref <5.7)
Mean Plasma Glucose: 120 mg/dL
eAG (mmol/L): 6.6 mmol/L

## 2023-03-08 LAB — CBC WITH DIFFERENTIAL/PLATELET
Absolute Monocytes: 648 cells/uL (ref 200–950)
Basophils Absolute: 32 cells/uL (ref 0–200)
Basophils Relative: 0.4 %
Eosinophils Absolute: 243 cells/uL (ref 15–500)
Eosinophils Relative: 3 %
HCT: 45.9 % — ABNORMAL HIGH (ref 35.0–45.0)
Hemoglobin: 15.2 g/dL (ref 11.7–15.5)
Lymphs Abs: 3410 cells/uL (ref 850–3900)
MCH: 28.8 pg (ref 27.0–33.0)
MCHC: 33.1 g/dL (ref 32.0–36.0)
MCV: 86.9 fL (ref 80.0–100.0)
MPV: 10.9 fL (ref 7.5–12.5)
Monocytes Relative: 8 %
Neutro Abs: 3767 cells/uL (ref 1500–7800)
Neutrophils Relative %: 46.5 %
Platelets: 275 10*3/uL (ref 140–400)
RBC: 5.28 10*6/uL — ABNORMAL HIGH (ref 3.80–5.10)
RDW: 14.5 % (ref 11.0–15.0)
Total Lymphocyte: 42.1 %
WBC: 8.1 10*3/uL (ref 3.8–10.8)

## 2023-03-08 LAB — VITAMIN D 25 HYDROXY (VIT D DEFICIENCY, FRACTURES): Vit D, 25-Hydroxy: 63 ng/mL (ref 30–100)

## 2023-03-12 ENCOUNTER — Encounter: Payer: Self-pay | Admitting: Family Medicine

## 2023-03-12 ENCOUNTER — Ambulatory Visit (INDEPENDENT_AMBULATORY_CARE_PROVIDER_SITE_OTHER): Payer: 59 | Admitting: Family Medicine

## 2023-03-12 VITALS — BP 124/72 | HR 70 | Temp 98.5°F | Ht 63.0 in | Wt 209.0 lb

## 2023-03-12 DIAGNOSIS — E7800 Pure hypercholesterolemia, unspecified: Secondary | ICD-10-CM

## 2023-03-12 DIAGNOSIS — R7303 Prediabetes: Secondary | ICD-10-CM | POA: Diagnosis not present

## 2023-03-12 DIAGNOSIS — Z6836 Body mass index (BMI) 36.0-36.9, adult: Secondary | ICD-10-CM | POA: Diagnosis not present

## 2023-03-12 DIAGNOSIS — Z0001 Encounter for general adult medical examination with abnormal findings: Secondary | ICD-10-CM

## 2023-03-12 DIAGNOSIS — Z0000 Encounter for general adult medical examination without abnormal findings: Secondary | ICD-10-CM

## 2023-03-12 DIAGNOSIS — Z1211 Encounter for screening for malignant neoplasm of colon: Secondary | ICD-10-CM | POA: Diagnosis not present

## 2023-03-12 NOTE — Progress Notes (Signed)
Subjective:    Patient ID: Karen [name], female    DOB: 06/05/1976, 47 y.o.   MRN: [id number]  Patient is a very pleasant 47 year old Caucasian female here today for physical exam.  Since I last saw the patient, she has embarked on a weight loss program.  She is dropped her total cholesterol 40 points.  She dropped her LDL cholesterol 20 points.  She does not smoke.  Her blood pressure today is excellent.  Her mammogram is due in September.  She is due for colon cancer screening.  We discussed colonoscopy versus Cologuard and she elects Cologuard.  Her last Pap smear was in 2021.  She is due today however she is on her menstrual cycle but she prefers to wait to a different date to do a Pap smear.  She is due for an RSV vaccine.  We discussed this.  She is also due for COVID booster and we discussed this.  Outside of that she is doing very well Lab on 03/07/2023  Component Date Value Ref Range Status   WBC 03/07/2023 8.1  3.8 - 10.8 Thousand/uL Final   RBC 03/07/2023 5.28 (H)  3.80 - 5.10 Million/uL Final   Hemoglobin 03/07/2023 15.2  11.7 - 15.5 g/dL Final   HCT 40/98/1191 45.9 (H)  35.0 - 45.0 % Final   MCV 03/07/2023 86.9  80.0 - 100.0 fL Final   MCH 03/07/2023 28.8  27.0 - 33.0 pg Final   MCHC 03/07/2023 33.1  32.0 - 36.0 g/dL Final   RDW 47/82/9562 14.5  11.0 - 15.0 % Final   Platelets 03/07/2023 275  140 - 400 Thousand/uL Final   MPV 03/07/2023 10.9  7.5 - 12.5 fL Final   Neutro Abs 03/07/2023 3,767  1,500 - 7,800 cells/uL Final   Lymphs Abs 03/07/2023 3,410  850 - 3,900 cells/uL Final   Absolute Monocytes 03/07/2023 648  200 - 950 cells/uL Final   Eosinophils Absolute 03/07/2023 243  15 - 500 cells/uL Final   Basophils Absolute 03/07/2023 32  0 - 200 cells/uL Final   Neutrophils Relative % 03/07/2023 46.5  % Final   Total Lymphocyte 03/07/2023 42.1  % Final   Monocytes Relative 03/07/2023 8.0  % Final   Eosinophils Relative 03/07/2023 3.0  % Final   Basophils Relative 03/07/2023  0.4  % Final   Glucose, Bld 03/07/2023 91  65 - 99 mg/dL Final   Comment: .            Fasting reference interval .    BUN 03/07/2023 8  7 - 25 mg/dL Final   Creat 13/05/6577 0.70  0.50 - 0.99 mg/dL Final   eGFR 46/96/2952 107  > OR = 60 mL/min/1.53m2 Final   BUN/Creatinine Ratio 03/07/2023 SEE NOTE:  6 - 22 (calc) Final   Comment:    Not Reported: BUN and Creatinine are within    reference range. .    Sodium 03/07/2023 141  135 - 146 mmol/L Final   Potassium 03/07/2023 3.7  3.5 - 5.3 mmol/L Final   Chloride 03/07/2023 110  98 - 110 mmol/L Final   CO2 03/07/2023 19 (L)  20 - 32 mmol/L Final   Calcium 03/07/2023 9.3  8.6 - 10.2 mg/dL Final   Total Protein 84/13/2440 6.6  6.1 - 8.1 g/dL Final   Albumin 08/11/2535 4.1  3.6 - 5.1 g/dL Final   Globulin 64/40/3474 2.5  1.9 - 3.7 g/dL (calc) Final   AG Ratio 03/07/2023 1.6  1.0 -  2.5 (calc) Final   Total Bilirubin 03/07/2023 0.2  0.2 - 1.2 mg/dL Final   Alkaline phosphatase (APISO) 03/07/2023 55  31 - 125 U/L Final   AST 03/07/2023 10  10 - 35 U/L Final   ALT 03/07/2023 8  6 - 29 U/L Final   Cholesterol 03/07/2023 214 (H)  <200 mg/dL Final   HDL 16/07/9603 46 (L)  > OR = 50 mg/dL Final   Triglycerides 54/06/8118 105  <150 mg/dL Final   LDL Cholesterol (Calc) 03/07/2023 146 (H)  mg/dL (calc) Final   Comment: Reference range: <100 . Desirable range <100 mg/dL for primary prevention;   <70 mg/dL for patients with CHD or diabetic patients  with > or = 2 CHD risk factors. Marland Kitchen LDL-C is now calculated using the Martin-Hopkins  calculation, which is a validated novel method providing  better accuracy than the Friedewald equation in the  estimation of LDL-C.  Horald Pollen et al. Lenox Ahr. 1478;295(62): 2061-2068  (http://education.[facility].com/faq/FAQ164)    Total CHOL/HDL Ratio 03/07/2023 4.7  <1.3 (calc) Final   Non-HDL Cholesterol (Calc) 03/07/2023 168 (H)  <130 mg/dL (calc) Final   Comment: For patients with diabetes plus 1 major  ASCVD risk  factor, treating to a non-HDL-C goal of <100 mg/dL  (LDL-C of <08 mg/dL) is considered a therapeutic  option.    TSH 03/07/2023 2.55  mIU/L Final   Comment:           Reference Range .           > or = 20 Years  0.40-4.50 .                Pregnancy Ranges           First trimester    0.26-2.66           Second trimester   0.55-2.73           Third trimester    0.43-2.91    Vit D, 25-Hydroxy 03/07/2023 63  30 - 100 ng/mL Final   Comment: Vitamin D Status         25-OH Vitamin D: . Deficiency:                    <20 ng/mL Insufficiency:             20 - 29 ng/mL Optimal:                 > or = 30 ng/mL . For 25-OH Vitamin D testing on patients on  D2-supplementation and patients for whom quantitation  of D2 and D3 fractions is required, the [facility](TM) 25-OH VIT D, (D2,D3), LC/MS/MS is recommended: order  code 65784 (patients >44yrs). . See Note 1 . Note 1 . For additional information, please refer to  http://education.[facility].com/faq/FAQ199  (This link is being provided for informational/ educational purposes only.)    Hgb A1c MFr Bld 03/07/2023 5.8 (H)  <5.7 % of total Hgb Final   Comment: For someone without known diabetes, a hemoglobin  A1c value between 5.7% and 6.4% is consistent with prediabetes and should be confirmed with a  follow-up test. . For someone with known diabetes, a value <7% indicates that their diabetes is well controlled. A1c targets should be individualized based on duration of diabetes, age, comorbid conditions, and other considerations. . This assay result is consistent with an increased risk of diabetes. . Currently, no consensus exists regarding use of hemoglobin A1c for diagnosis of diabetes for children. Marland Kitchen  Mean Plasma Glucose 03/07/2023 120  mg/dL Final   eAG (mmol/L) 16/07/9603 6.6  mmol/L Final   Comment: . This test was performed on the Roche cobas c503 platform. Effective 07/23/22, a change in test  platforms from the Abbott Architect to the Roche cobas c503 may have shifted HbA1c results compared to historical results. Based on laboratory validation testing conducted at [facility], the Roche platform relative to the Abbott platform had an average increase in HbA1c value of < or = 0.3%. This difference is within accepted  variability established by the [facility]. Note that not all individuals will have had a shift in their results and direct comparisons between historical and current results for testing conducted on different platforms is not recommended.      Past Medical History:  Diagnosis Date   Anxiety    Depression    Gallbladder problem    GERD (gastroesophageal reflux disease)    Gluten intolerance    Heartburn    High cholesterol    Hypothyroidism    subclinical/borderline   Lactose intolerance    Lactose intolerance    Palpitations    Prediabetes    Smoker 02/23/2013   Smoker    SOBOE (shortness of breath on exertion)    Thyroid disease    Phreesia 03/07/2020   Vitamin D deficiency    Past Surgical History:  Procedure Laterality Date   BREAST LUMPECTOMY WITH RADIOACTIVE SEED LOCALIZATION Right 07/20/2020   Procedure: RIGHT BREAST LUMPECTOMY WITH RADIOACTIVE SEED LOCALIZATION;  Surgeon: Harriette Bouillon, MD;  Location: [facility];  Service: General;  Laterality: Right;   CHOLECYSTECTOMY N/A    age 89   CHOLECYSTECTOMY, LAPAROSCOPIC      Current Outpatient Medications on File Prior to Visit  Medication Sig Dispense Refill   aspirin EC 81 MG tablet Take 81 mg by mouth daily.     fish oil-omega-3 fatty acids 1000 MG capsule Take 1,000 mg by mouth daily.      levothyroxine (SYNTHROID) 88 MCG tablet Take 1 tablet (88 mcg total) by mouth daily. 90 tablet 3   magnesium gluconate (MAGONATE) 500 MG tablet Take 500 mg by mouth 2 (two) times daily.     Multiple Vitamin (MULTIVITAMIN ADULT PO) Take by mouth. 14,000  IU weekly     pantoprazole (PROTONIX) 40 MG tablet Take 1 tablet (40 mg total) by mouth daily before breakfast. 90 tablet 3   tirzepatide (ZEPBOUND) 7.5 MG/0.5ML Pen Inject 7.5 mg into the skin once a week. 2 mL 0   VITAMIN D PO Take by mouth.     No current facility-administered medications on file prior to visit.   Allergies  Allergen Reactions   Penicillins Hives   Social History   Socioeconomic History   Marital status: Married    Spouse name: Lakesha Amaker   Number of children: 3   Years of education: Not on file   Highest education level: Not on file  Occupational History   Not on file  Tobacco Use   Smoking status: Former    Packs/day: .5    Types: Cigarettes    Quit date: 2017    Years since quitting: 7.4    Passive exposure: Past   Smokeless tobacco: Former    Quit date: 03/25/2016  Vaping Use   Vaping Use: Former   Quit date: 03/25/2016  Substance and Sexual Activity   Alcohol use: Not Currently    Alcohol/week: 0.0 standard drinks of alcohol  Comment: Rare-maybe once a year   Drug use: No   Sexual activity: Yes  Other Topics Concern   Not on file  Social History Narrative   Not on file   Social Determinants of Health   Financial Resource Strain: Not on file  Food Insecurity: Not on file  Transportation Needs: Not on file  Physical Activity: Not on file  Stress: Not on file  Social Connections: Not on file  Intimate Partner Violence: Not on file   Family History  Problem Relation Age of Onset   Diabetes Mother    Hyperlipidemia Mother    Hypertension Mother    Anxiety disorder Mother    Obesity Mother    Anxiety disorder Father    Hypertension Father    Depression Father    Colon cancer Neg Hx      Review of Systems     Objective:   Physical Exam Constitutional:      General: She is not in acute distress.    Appearance: She is well-developed. She is not diaphoretic.  HENT:     Head: Normocephalic and atraumatic.     Right Ear:  External ear normal.     Left Ear: External ear normal.     Nose: Nose normal.     Mouth/Throat:     Pharynx: No oropharyngeal exudate.  Eyes:     General: No scleral icterus.       Right eye: No discharge.        Left eye: No discharge.     Conjunctiva/sclera: Conjunctivae normal.     Pupils: Pupils are equal, round, and reactive to light.  Neck:     Thyroid: No thyromegaly.     Vascular: No JVD.     Trachea: No tracheal deviation.  Cardiovascular:     Rate and Rhythm: Normal rate and regular rhythm.     Heart sounds: Normal heart sounds. No murmur heard.    No friction rub. No gallop.  Pulmonary:     Effort: Pulmonary effort is normal. No respiratory distress.     Breath sounds: Normal breath sounds. No stridor. No wheezing or rales.  Chest:     Chest wall: No tenderness.  Abdominal:     General: Bowel sounds are normal. There is no distension.     Palpations: Abdomen is soft.     Tenderness: There is no abdominal tenderness. There is no guarding or rebound.  Musculoskeletal:        General: No tenderness or deformity. Normal range of motion.     Cervical back: Normal range of motion and neck supple.  Lymphadenopathy:     Cervical: No cervical adenopathy.  Skin:    General: Skin is warm.     Coloration: Skin is not pale.  Neurological:     Mental Status: She is alert and oriented to person, place, and time.     Cranial Nerves: No cranial nerve deficit.     Motor: No abnormal muscle tone.     Coordination: Coordination normal.     Deep Tendon Reflexes: Reflexes are normal and symmetric.  Psychiatric:        Behavior: Behavior normal.        Thought Content: Thought content normal.        Judgment: Judgment normal.           Assessment & Plan:  Colon cancer screening - Plan: Cologuard  Prediabetes  Pure hypercholesterolemia  BMI 36.0-36.9,adult  General medical exam I  am very proud of the patient for losing weight and dropping her cholesterol.  I  congratulated her on this.  I recommend that she at least take fish oil  2000 mg daily to help lower her cholesterol.  She will schedule her mammogram in September.  I will schedule the patient for Cologuard per her request.  She defers her Pap smear today due to heavy bleeding.  We can do this anytime she is comfortable.  Blood pressure is excellent.  The remainder of her blood work is up-to-date.  Recommended a COVID booster.  Discussed RSV vaccination as she cares for her young grandchild.  Hemoglobin A1c is down to 5.8 with a weight loss.  Congratulated the patient on this

## 2023-03-27 DIAGNOSIS — Z1211 Encounter for screening for malignant neoplasm of colon: Secondary | ICD-10-CM | POA: Diagnosis not present

## 2023-04-02 ENCOUNTER — Other Ambulatory Visit: Payer: Self-pay | Admitting: Family Medicine

## 2023-04-02 ENCOUNTER — Other Ambulatory Visit (HOSPITAL_COMMUNITY): Payer: Self-pay

## 2023-04-02 ENCOUNTER — Ambulatory Visit (INDEPENDENT_AMBULATORY_CARE_PROVIDER_SITE_OTHER): Payer: 59 | Admitting: Family Medicine

## 2023-04-02 ENCOUNTER — Encounter (INDEPENDENT_AMBULATORY_CARE_PROVIDER_SITE_OTHER): Payer: Self-pay | Admitting: Family Medicine

## 2023-04-02 VITALS — BP 129/81 | HR 76 | Temp 97.8°F | Ht 63.0 in | Wt 197.0 lb

## 2023-04-02 DIAGNOSIS — E7849 Other hyperlipidemia: Secondary | ICD-10-CM | POA: Diagnosis not present

## 2023-04-02 DIAGNOSIS — R632 Polyphagia: Secondary | ICD-10-CM

## 2023-04-02 DIAGNOSIS — E669 Obesity, unspecified: Secondary | ICD-10-CM | POA: Diagnosis not present

## 2023-04-02 DIAGNOSIS — Z6834 Body mass index (BMI) 34.0-34.9, adult: Secondary | ICD-10-CM

## 2023-04-02 DIAGNOSIS — R7303 Prediabetes: Secondary | ICD-10-CM

## 2023-04-02 DIAGNOSIS — R195 Other fecal abnormalities: Secondary | ICD-10-CM

## 2023-04-02 LAB — COLOGUARD: COLOGUARD: POSITIVE — AB

## 2023-04-02 MED ORDER — ZEPBOUND 7.5 MG/0.5ML ~~LOC~~ SOAJ
7.5000 mg | SUBCUTANEOUS | 0 refills | Status: DC
Start: 2023-04-02 — End: 2023-08-13
  Filled 2023-04-02 – 2023-04-09 (×2): qty 2, 28d supply, fill #0

## 2023-04-03 ENCOUNTER — Other Ambulatory Visit (HOSPITAL_COMMUNITY): Payer: Self-pay

## 2023-04-03 ENCOUNTER — Other Ambulatory Visit: Payer: Self-pay

## 2023-04-03 NOTE — Progress Notes (Unsigned)
Chief Complaint:   OBESITY Karen [name] is here to discuss her progress with her obesity treatment plan along with follow-up of her obesity related diagnoses. Karen [name] is on the Category 2 Plan and states she is following her eating plan approximately 100% of the time. Karen [name] states she is doing resistance for 5-10 minutes 3 times per week.  Today's visit was #: 14 Starting weight: 224 lbs Starting date: 05/31/2022 Today's weight: 197 lbs Today's date: 04/02/2023 Total lbs lost to date: 27 Total lbs lost since last in-office visit: 9  Interim History: Patient has done very well with her weight loss.  She is working on following her category 2 plan and meeting her protein goals.  Her hunger is appropriate.  Subjective:   1. Polyphagia Patient is doing well on Zepbound, and she is taking it every other week.  She feels it is helping.  She denies abdominal pain or nausea or vomiting.  2. Other hyperlipidemia Patient has questions about her lab results and how certain foods affect this.  I discussed labs with the patient today.  3. Prediabetes Patient's A1c is improving with her diet.  She denies nausea or vomiting.  Assessment/Plan:   1. Polyphagia Patient will continue Zepbound 7.5 mg, and we will refill for 1 month.  - tirzepatide (ZEPBOUND) 7.5 MG/0.5ML Pen; Inject 7.5 mg into the skin once a week.  Dispense: 2 mL; Refill: 0  2. Other hyperlipidemia We discussed various types of cholesterol, how specific foods affect these results, and the role of genetics in cholesterol.  3. Prediabetes Patient is to continue with her diet and weight loss.  4. BMI 34.0-34.9,adult  5. Obesity, Beginning BMI 39.68 Karen [name] is currently in the action stage of change. As such, her goal is to continue with weight loss efforts. She has agreed to the Category 2 Plan.   Exercise goals: As is.   Behavioral modification strategies: increasing lean protein intake and decreasing simple carbohydrates.  Karen [name]  has agreed to follow-up with our clinic in 4 weeks. She was informed of the importance of frequent follow-up visits to maximize her success with intensive lifestyle modifications for her multiple health conditions.   Objective:   Blood pressure 129/81, pulse 76, temperature 97.8 F (36.6 C), height 5\' 3"  (1.6 m), weight 197 lb (89.4 kg), last menstrual period 03/10/2023, SpO2 95 %. Body mass index is 34.9 kg/m.  Lab Results  Component Value Date   CREATININE 0.70 03/07/2023   BUN 8 03/07/2023   NA 141 03/07/2023   K 3.7 03/07/2023   CL 110 03/07/2023   CO2 19 (L) 03/07/2023   Lab Results  Component Value Date   ALT 8 03/07/2023   AST 10 03/07/2023   ALKPHOS 73 12/27/2016   BILITOT 0.2 03/07/2023   Lab Results  Component Value Date   HGBA1C 5.8 (H) 03/07/2023   HGBA1C 6.0 (H) 05/31/2022   HGBA1C 5.2 11/13/2021   Lab Results  Component Value Date   INSULIN 20.7 05/31/2022   Lab Results  Component Value Date   TSH 2.55 03/07/2023   Lab Results  Component Value Date   CHOL 214 (H) 03/07/2023   HDL 46 (L) 03/07/2023   LDLCALC 146 (H) 03/07/2023   TRIG 105 03/07/2023   CHOLHDL 4.7 03/07/2023   Lab Results  Component Value Date   VD25OH 63 03/07/2023   VD25OH 60 03/16/2014   Lab Results  Component Value Date   WBC 8.1 03/07/2023   HGB 15.2 03/07/2023  HCT 45.9 (H) 03/07/2023   MCV 86.9 03/07/2023   PLT 275 03/07/2023   No results found for: "IRON", "TIBC", "FERRITIN"  Attestation Statements:   Reviewed by clinician on day of visit: allergies, medications, problem list, medical history, surgical history, family history, social history, and previous encounter notes.   I, Burt Knack, am acting as transcriptionist for Quillian Quince, MD.  I have reviewed the above documentation for accuracy and completeness, and I agree with the above. -  Quillian Quince, MD

## 2023-04-09 ENCOUNTER — Other Ambulatory Visit (HOSPITAL_COMMUNITY): Payer: Self-pay

## 2023-04-09 ENCOUNTER — Other Ambulatory Visit: Payer: Self-pay

## 2023-04-10 ENCOUNTER — Other Ambulatory Visit: Payer: Self-pay

## 2023-04-16 ENCOUNTER — Ambulatory Visit (INDEPENDENT_AMBULATORY_CARE_PROVIDER_SITE_OTHER): Payer: 59 | Admitting: Family Medicine

## 2023-04-16 ENCOUNTER — Other Ambulatory Visit (HOSPITAL_COMMUNITY): Payer: Self-pay

## 2023-04-16 ENCOUNTER — Other Ambulatory Visit: Payer: Self-pay

## 2023-04-16 VITALS — BP 124/78 | HR 85 | Temp 99.0°F | Wt 199.2 lb

## 2023-04-16 DIAGNOSIS — Z124 Encounter for screening for malignant neoplasm of cervix: Secondary | ICD-10-CM

## 2023-04-16 DIAGNOSIS — R195 Other fecal abnormalities: Secondary | ICD-10-CM

## 2023-04-16 MED ORDER — ALPRAZOLAM 0.5 MG PO TABS
0.5000 mg | ORAL_TABLET | Freq: Three times a day (TID) | ORAL | 0 refills | Status: DC | PRN
  Filled 2023-04-16: qty 5, 2d supply, fill #0

## 2023-04-16 NOTE — Progress Notes (Signed)
Subjective:    Patient ID: Karen [name], female    DOB: Feb 28, 1976, 47 y.o.   MRN: [id number]   Patient here today for pap smear.  Recently, had positive cologuard.  Would like to see GI in [place].  Very anxious about test.  Asked for xanax to take the day before due to anxiety.   Past Medical History:  Diagnosis Date   Anxiety    Depression    Gallbladder problem    GERD (gastroesophageal reflux disease)    Gluten intolerance    Heartburn    High cholesterol    Hypothyroidism    subclinical/borderline   Lactose intolerance    Lactose intolerance    Palpitations    Prediabetes    Smoker 02/23/2013   Smoker    SOBOE (shortness of breath on exertion)    Thyroid disease    Phreesia 03/07/2020   Vitamin D deficiency    Past Surgical History:  Procedure Laterality Date   BREAST LUMPECTOMY WITH RADIOACTIVE SEED LOCALIZATION Right 07/20/2020   Procedure: RIGHT BREAST LUMPECTOMY WITH RADIOACTIVE SEED LOCALIZATION;  Surgeon: Harriette Bouillon, MD;  Location: [facility];  Service: General;  Laterality: Right;   CHOLECYSTECTOMY N/A    age 48   CHOLECYSTECTOMY, LAPAROSCOPIC      Current Outpatient Medications on File Prior to Visit  Medication Sig Dispense Refill   aspirin EC 81 MG tablet Take 81 mg by mouth daily.     fish oil-omega-3 fatty acids 1000 MG capsule Take 1,000 mg by mouth daily.      levothyroxine (SYNTHROID) 88 MCG tablet Take 1 tablet (88 mcg total) by mouth daily. 90 tablet 3   magnesium gluconate (MAGONATE) 500 MG tablet Take 500 mg by mouth 2 (two) times daily.     Multiple Vitamin (MULTIVITAMIN ADULT PO) Take by mouth. 14,000 IU weekly     pantoprazole (PROTONIX) 40 MG tablet Take 1 tablet (40 mg total) by mouth daily before breakfast. 90 tablet 3   tirzepatide (ZEPBOUND) 7.5 MG/0.5ML Pen Inject 7.5 mg into the skin once a week. 2 mL 0   VITAMIN D PO Take by mouth.     No current facility-administered medications on file prior to visit.    Allergies  Allergen Reactions   Penicillins Hives   Social History   Socioeconomic History   Marital status: Married    Spouse name: Richel Vivenzio   Number of children: 3   Years of education: Not on file   Highest education level: Not on file  Occupational History   Not on file  Tobacco Use   Smoking status: Former    Packs/day: .5    Types: Cigarettes    Quit date: 2017    Years since quitting: 7.5    Passive exposure: Past   Smokeless tobacco: Former    Quit date: 03/25/2016  Vaping Use   Vaping Use: Former   Quit date: 03/25/2016  Substance and Sexual Activity   Alcohol use: Not Currently    Alcohol/week: 0.0 standard drinks of alcohol    Comment: Rare-maybe once a year   Drug use: No   Sexual activity: Yes  Other Topics Concern   Not on file  Social History Narrative   Not on file   Social Determinants of Health   Financial Resource Strain: Not on file  Food Insecurity: Not on file  Transportation Needs: Not on file  Physical Activity: Not on file  Stress: Not on file  Social  Connections: Not on file  Intimate Partner Violence: Not on file   Family History  Problem Relation Age of Onset   Diabetes Mother    Hyperlipidemia Mother    Hypertension Mother    Anxiety disorder Mother    Obesity Mother    Anxiety disorder Father    Hypertension Father    Depression Father    Colon cancer Neg Hx      Review of Systems     Objective:   Physical Exam Exam conducted with a chaperone present.  Constitutional:      General: She is not in acute distress.    Appearance: She is well-developed. She is not diaphoretic.  HENT:     Head: Normocephalic and atraumatic.  Neck:     Thyroid: No thyromegaly.     Vascular: No JVD.     Trachea: No tracheal deviation.  Cardiovascular:     Rate and Rhythm: Normal rate and regular rhythm.     Heart sounds: Normal heart sounds. No murmur heard.    No friction rub. No gallop.  Pulmonary:     Effort: Pulmonary  effort is normal. No respiratory distress.     Breath sounds: Normal breath sounds. No stridor. No wheezing or rales.  Chest:     Chest wall: No tenderness.  Genitourinary:    Exam position: Lithotomy position.     Labia:        Right: No rash.        Left: No rash.      Vagina: Normal.     Cervix: No cervical motion tenderness, friability or erythema.     Uterus: Normal.      Adnexa: Right adnexa normal and left adnexa normal.       Right: No mass.         Left: No mass.    Musculoskeletal:     Cervical back: Normal range of motion and neck supple.  Lymphadenopathy:     Cervical: No cervical adenopathy.     Lower Body: No right inguinal adenopathy. No left inguinal adenopathy.  Skin:    General: Skin is warm.     Coloration: Skin is not pale.  Neurological:     Mental Status: She is alert and oriented to person, place, and time.     Cranial Nerves: No cranial nerve deficit.     Motor: No abnormal muscle tone.     Coordination: Coordination normal.     Deep Tendon Reflexes: Reflexes are normal and symmetric.  Psychiatric:        Behavior: Behavior normal.        Thought Content: Thought content normal.        Judgment: Judgment normal.           Assessment & Plan:  Cervical cancer screening - Plan: PAP, Thin Prep w/HPV rflx HPV Type 16/18  Positive colorectal cancer screening using Cologuard test - Plan: Ambulatory referral to Gastroenterology Pap sent to pathology in labeled container.  Pelvic exam is normal.  Schedule GI consult.  Gave patient one time RX for xanax for procedural anxiety.

## 2023-04-25 ENCOUNTER — Telehealth (INDEPENDENT_AMBULATORY_CARE_PROVIDER_SITE_OTHER): Payer: 59 | Admitting: Family Medicine

## 2023-04-25 LAB — TIQ-MISC

## 2023-04-25 LAB — PAP, TP IMAGING W/ HPV RNA, RFLX HPV TYPE 16,18/45: HPV DNA High Risk: NOT DETECTED

## 2023-04-26 ENCOUNTER — Other Ambulatory Visit: Payer: Self-pay

## 2023-05-01 ENCOUNTER — Ambulatory Visit: Payer: 59 | Admitting: Internal Medicine

## 2023-05-09 ENCOUNTER — Telehealth (INDEPENDENT_AMBULATORY_CARE_PROVIDER_SITE_OTHER): Payer: Self-pay

## 2023-05-09 NOTE — Telephone Encounter (Signed)
Prior authorization from the insurance company for Zepbound is below:  This drug/product is not covered under the pharmacy benefit. Prior Authorization is not available.

## 2023-05-20 ENCOUNTER — Other Ambulatory Visit (HOSPITAL_COMMUNITY): Payer: Self-pay

## 2023-05-20 DIAGNOSIS — Z1211 Encounter for screening for malignant neoplasm of colon: Secondary | ICD-10-CM | POA: Diagnosis not present

## 2023-05-20 DIAGNOSIS — K219 Gastro-esophageal reflux disease without esophagitis: Secondary | ICD-10-CM | POA: Diagnosis not present

## 2023-05-20 DIAGNOSIS — K625 Hemorrhage of anus and rectum: Secondary | ICD-10-CM | POA: Diagnosis not present

## 2023-05-20 MED ORDER — CLENPIQ 10-3.5-12 MG-GM -GM/175ML PO SOLN
ORAL | 0 refills | Status: DC
  Filled 2023-05-20: qty 350, 1d supply, fill #0

## 2023-05-21 ENCOUNTER — Other Ambulatory Visit (HOSPITAL_COMMUNITY): Payer: Self-pay

## 2023-05-23 ENCOUNTER — Ambulatory Visit (INDEPENDENT_AMBULATORY_CARE_PROVIDER_SITE_OTHER): Payer: 59 | Admitting: Family Medicine

## 2023-05-28 DIAGNOSIS — K635 Polyp of colon: Secondary | ICD-10-CM | POA: Diagnosis not present

## 2023-05-28 DIAGNOSIS — D122 Benign neoplasm of ascending colon: Secondary | ICD-10-CM | POA: Diagnosis not present

## 2023-05-28 DIAGNOSIS — K5730 Diverticulosis of large intestine without perforation or abscess without bleeding: Secondary | ICD-10-CM | POA: Diagnosis not present

## 2023-05-28 DIAGNOSIS — D124 Benign neoplasm of descending colon: Secondary | ICD-10-CM | POA: Diagnosis not present

## 2023-05-28 DIAGNOSIS — Z1211 Encounter for screening for malignant neoplasm of colon: Secondary | ICD-10-CM | POA: Diagnosis not present

## 2023-05-28 LAB — HM COLONOSCOPY

## 2023-06-04 DIAGNOSIS — H5211 Myopia, right eye: Secondary | ICD-10-CM | POA: Diagnosis not present

## 2023-06-12 ENCOUNTER — Other Ambulatory Visit (HOSPITAL_COMMUNITY): Payer: Self-pay

## 2023-06-12 ENCOUNTER — Encounter (INDEPENDENT_AMBULATORY_CARE_PROVIDER_SITE_OTHER): Payer: Self-pay | Admitting: Family Medicine

## 2023-06-12 ENCOUNTER — Ambulatory Visit (INDEPENDENT_AMBULATORY_CARE_PROVIDER_SITE_OTHER): Payer: 59 | Admitting: Family Medicine

## 2023-06-12 VITALS — BP 138/78 | HR 76 | Temp 98.8°F | Ht 63.0 in | Wt 190.0 lb

## 2023-06-12 DIAGNOSIS — Z6833 Body mass index (BMI) 33.0-33.9, adult: Secondary | ICD-10-CM

## 2023-06-12 DIAGNOSIS — F32A Depression, unspecified: Secondary | ICD-10-CM | POA: Insufficient documentation

## 2023-06-12 DIAGNOSIS — E669 Obesity, unspecified: Secondary | ICD-10-CM | POA: Diagnosis not present

## 2023-06-12 DIAGNOSIS — F3289 Other specified depressive episodes: Secondary | ICD-10-CM

## 2023-06-12 DIAGNOSIS — R030 Elevated blood-pressure reading, without diagnosis of hypertension: Secondary | ICD-10-CM | POA: Diagnosis not present

## 2023-06-12 MED ORDER — BUPROPION HCL ER (SR) 150 MG PO TB12
150.0000 mg | ORAL_TABLET | Freq: Every morning | ORAL | 0 refills | Status: DC
Start: 2023-06-12 — End: 2023-08-13
  Filled 2023-06-12: qty 90, 90d supply, fill #0

## 2023-06-12 NOTE — Progress Notes (Signed)
.[facility]  Office: [phone]  /  Fax: [phone]  WEIGHT SUMMARY AND BIOMETRICS  Anthropometric Measurements Height: 5\' 3"  (1.6 m) Weight: 190 lb (86.2 kg) BMI (Calculated): 33.67 Weight at Last Visit: 197 lb Weight Lost Since Last Visit: 7 lb Weight Gained Since Last Visit: 0   Body Composition  Body Fat %: 45.8 % Fat Mass (lbs): 87.2 lbs Muscle Mass (lbs): 98 lbs Total Body Water (lbs): 78.4 lbs Visceral Fat Rating : 11   Other Clinical Data Fasting: No Labs: No Today's Visit #: 15    Chief Complaint: OBESITY  History of Present Illness   The patient, with a history of obesity and prehypertension, presents for a follow-up visit after an eight-week interval. She reports a weight loss of seven pounds during this period, attributing this to adherence to a category two eating plan approximately 90% of the time. However, she expresses concerns about a perceived plateau in her weight loss, which has been stable for about a month. She denies any issues with hunger but admits to occasional cravings, particularly for chocolate during stressful situations at work.  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 She expresses a desire to incorporate exercise into her routine but struggles with motivation. She suggests that a treadmill workout of 10-15 minutes could be a feasible starting point.  The patient also discusses challenges with her eating schedule due to working the night shift, which she believes disrupts her routine, particularly on her days off. She has no new medications and is currently not on any weight loss medications. She has previously tried Wellbutrin, which she found initially effective but later felt her body became accustomed to it. She is open to trying it again.  The patient's blood pressure at this visit is 138/78, which continues to  fall in the prehypertension range. She expresses frustration with previous attempts at weight loss, where she experienced rapid weight regain after a period of successful weight loss and exercise. She expresses a desire to find a sustainable and achievable balance in her weight loss efforts.          PHYSICAL EXAM:  Blood pressure 138/78, pulse 76, temperature 98.8 F (37.1 C), height 5\' 3"  (1.6 m), weight 190 lb (86.2 kg), SpO2 94%. Body mass index is 33.66 kg/m.  DIAGNOSTIC DATA REVIEWED:  BMET    Component Value Date/Time   NA 141 03/07/2023 0857   K 3.7 03/07/2023 0857   CL 110 03/07/2023 0857   CO2 19 (L) 03/07/2023 0857   GLUCOSE 91 03/07/2023 0857   BUN 8 03/07/2023 0857   CREATININE 0.70 03/07/2023 0857   CALCIUM 9.3 03/07/2023 0857   GFRNONAA 105 04/06/2021 1113   GFRAA 121 04/06/2021 1113   Lab Results  Component Value Date   HGBA1C 5.8 (H) 03/07/2023   HGBA1C 5.2 11/13/2021   Lab Results  Component Value Date   INSULIN 20.7 05/31/2022   Lab Results  Component Value Date   TSH 2.55 03/07/2023   CBC    Component Value Date/Time   WBC 8.1 03/07/2023 0857   RBC 5.28 (H) 03/07/2023 0857   HGB 15.2 03/07/2023 0857   HGB 15.7 05/31/2022 1156   HCT 45.9 (H) 03/07/2023 0857   HCT 46.2 05/31/2022 1156   PLT 275 03/07/2023 0857   PLT 268 05/31/2022 1156   MCV 86.9 03/07/2023 0857  MCV 87 05/31/2022 1156   MCH 28.8 03/07/2023 0857   MCHC 33.1 03/07/2023 0857   RDW 14.5 03/07/2023 0857   RDW 14.1 05/31/2022 1156   Iron Studies No results found for: "IRON", "TIBC", "FERRITIN", "IRONPCTSAT" Lipid Panel     Component Value Date/Time   CHOL 214 (H) 03/07/2023 0857   TRIG 105 03/07/2023 0857   HDL 46 (L) 03/07/2023 0857   CHOLHDL 4.7 03/07/2023 0857   VLDL 25 12/27/2016 1115   LDLCALC 146 (H) 03/07/2023 0857   Hepatic Function Panel     Component Value Date/Time   PROT 6.6 03/07/2023 0857   ALBUMIN 3.9 12/27/2016 1115   AST 10 03/07/2023 0857    ALT 8 03/07/2023 0857   ALKPHOS 73 12/27/2016 1115   BILITOT 0.2 03/07/2023 0857      Component Value Date/Time   TSH 2.55 03/07/2023 0857   Nutritional Lab Results  Component Value Date   VD25OH 63 03/07/2023   VD25OH 60 03/16/2014     Assessment and Plan    Obesity Weight loss of 7 pounds over the last 8 weeks. Adherence to category two eating plan approximately 90% of the time. No current exercise regimen. Patient reports plateauing in weight loss and struggles with stress-induced cravings at work.  -Discussed the role of exercise in weight maintenance and the body's resistance to weight loss. -Encouraged to continue category two eating plan and make sure to not skip meals.  Emotional Eating behaviors -Start Wellbutrin SR 150mg  daily to help reduce cravings. -Encouraged to incorporate exercise into daily routine, starting with 10-15 minutes on the treadmill.  Prehypertension Blood pressure 138/78, within prehypertension range. -Continue monitoring blood pressure and will continue with diet and weight loss to improve BP readings  Follow-up in 8 weeks to assess progress and adjust treatment plan as necessary.      She was informed of the importance of frequent follow up visits to maximize her success with intensive lifestyle modifications for her multiple health conditions.    Quillian Quince, MD

## 2023-06-13 ENCOUNTER — Other Ambulatory Visit: Payer: Self-pay

## 2023-06-28 ENCOUNTER — Other Ambulatory Visit (HOSPITAL_COMMUNITY): Payer: Self-pay | Admitting: Family Medicine

## 2023-06-28 DIAGNOSIS — Z1231 Encounter for screening mammogram for malignant neoplasm of breast: Secondary | ICD-10-CM

## 2023-07-04 NOTE — Telephone Encounter (Signed)
error 

## 2023-07-05 ENCOUNTER — Encounter: Payer: Self-pay | Admitting: Family Medicine

## 2023-07-08 ENCOUNTER — Encounter (HOSPITAL_COMMUNITY): Payer: Self-pay

## 2023-07-08 ENCOUNTER — Ambulatory Visit (HOSPITAL_COMMUNITY)
Admission: RE | Admit: 2023-07-08 | Discharge: 2023-07-08 | Disposition: A | Discharge status: Home / Self Care | Payer: 59 | Source: Ambulatory Visit

## 2023-07-08 DIAGNOSIS — Z1231 Encounter for screening mammogram for malignant neoplasm of breast: Secondary | ICD-10-CM | POA: Insufficient documentation

## 2023-07-16 ENCOUNTER — Ambulatory Visit (INDEPENDENT_AMBULATORY_CARE_PROVIDER_SITE_OTHER): Payer: 59 | Admitting: Family Medicine

## 2023-07-17 ENCOUNTER — Ambulatory Visit (INDEPENDENT_AMBULATORY_CARE_PROVIDER_SITE_OTHER): Payer: 59 | Admitting: Family Medicine

## 2023-08-13 ENCOUNTER — Ambulatory Visit (INDEPENDENT_AMBULATORY_CARE_PROVIDER_SITE_OTHER): Payer: 59 | Admitting: Family Medicine

## 2023-08-13 ENCOUNTER — Encounter (INDEPENDENT_AMBULATORY_CARE_PROVIDER_SITE_OTHER): Payer: Self-pay | Admitting: Family Medicine

## 2023-08-13 ENCOUNTER — Other Ambulatory Visit (HOSPITAL_COMMUNITY): Payer: Self-pay

## 2023-08-13 VITALS — BP 131/80 | HR 68 | Temp 98.3°F | Ht 63.0 in | Wt 179.0 lb

## 2023-08-13 DIAGNOSIS — F3289 Other specified depressive episodes: Secondary | ICD-10-CM | POA: Diagnosis not present

## 2023-08-13 DIAGNOSIS — R632 Polyphagia: Secondary | ICD-10-CM | POA: Diagnosis not present

## 2023-08-13 DIAGNOSIS — Z6831 Body mass index (BMI) 31.0-31.9, adult: Secondary | ICD-10-CM | POA: Diagnosis not present

## 2023-08-13 DIAGNOSIS — E669 Obesity, unspecified: Secondary | ICD-10-CM

## 2023-08-13 MED ORDER — ZEPBOUND 10 MG/0.5ML ~~LOC~~ SOAJ
10.0000 mg | SUBCUTANEOUS | 0 refills | Status: DC
  Filled 2023-08-13: qty 2, 28d supply, fill #0

## 2023-08-13 MED ORDER — BUPROPION HCL ER (SR) 150 MG PO TB12
150.0000 mg | ORAL_TABLET | Freq: Every morning | ORAL | 0 refills | Status: DC
  Filled 2023-08-13 – 2023-10-28 (×2): qty 90, 90d supply, fill #0

## 2023-08-13 NOTE — Progress Notes (Unsigned)
Chief Complaint:   OBESITY Karen [name] is here to discuss her progress with her obesity treatment plan along with follow-up of her obesity related diagnoses. [name] is on the Category 2 Plan and states she is following her eating plan approximately 90% of the time. [name] states she is doing 0 minutes 0 times per week.  Today's visit was #: 16 Starting weight: 224 lbs Starting date: 05/31/2022 Today's weight: 179 lbs Today's date: 08/13/2023 Total lbs lost to date: 45 Total lbs lost since last in-office visit: 11  Interim History: Patient has done well with her weight loss in the last 2 months.  She has been more active lately but no formal exercise.  She is working on meal planning and increasing her protein.  Subjective:   1. Polyphagia Patient is taking Zepbound 7.5 mg every other week due to cost.  She would like to increase to 10 mg every other week.  She denies nausea or vomiting.  She still notes some polyphagia.  2. Emotional Eating Behavior Patient is stable on Wellbutrin, and her blood pressure is good.  No side effects were noted.  Assessment/Plan:   1. Polyphagia Patient agreed to increase Zepbound to 10 mg once weekly, and we will refill for 1 month; (patient will take every other week for now).  - tirzepatide (ZEPBOUND) 10 MG/0.5ML Pen; Inject 10 mg into the skin once a week.  Dispense: 2 mL; Refill: 0  2. Emotional Eating Behavior Patient will continue Wellbutrin SR 150 mg every morning, and we will refill for 90 days.  - buPROPion (WELLBUTRIN SR) 150 MG 12 hr tablet; Take 1 tablet (150 mg total) by mouth every morning.  Dispense: 90 tablet; Refill: 0  3. BMI 31.0-31.9,adult  4. Obesity, Beginning BMI 39.68 [name] is currently in the action stage of change. As such, her goal is to continue with weight loss efforts. She has agreed to the Category 2 Plan.   Exercise goals: Patient is to increase her strengthening exercise such as chair yoga or wall  Pilates.  Behavioral modification strategies: meal planning and cooking strategies.  [name] has agreed to follow-up with our clinic in 8 to 10 weeks. She was informed of the importance of frequent follow-up visits to maximize her success with intensive lifestyle modifications for her multiple health conditions.   Objective:   Blood pressure 131/80, pulse 68, temperature 98.3 F (36.8 C), height 5\' 3"  (1.6 m), weight 179 lb (81.2 kg), SpO2 98%. Body mass index is 31.71 kg/m.  Lab Results  Component Value Date   CREATININE 0.70 03/07/2023   BUN 8 03/07/2023   NA 141 03/07/2023   K 3.7 03/07/2023   CL 110 03/07/2023   CO2 19 (L) 03/07/2023   Lab Results  Component Value Date   ALT 8 03/07/2023   AST 10 03/07/2023   ALKPHOS 73 12/27/2016   BILITOT 0.2 03/07/2023   Lab Results  Component Value Date   HGBA1C 5.8 (H) 03/07/2023   HGBA1C 6.0 (H) 05/31/2022   HGBA1C 5.2 11/13/2021   Lab Results  Component Value Date   INSULIN 20.7 05/31/2022   Lab Results  Component Value Date   TSH 2.55 03/07/2023   Lab Results  Component Value Date   CHOL 214 (H) 03/07/2023   HDL 46 (L) 03/07/2023   LDLCALC 146 (H) 03/07/2023   TRIG 105 03/07/2023   CHOLHDL 4.7 03/07/2023   Lab Results  Component Value Date   VD25OH 63 03/07/2023   VD25OH  60 03/16/2014   Lab Results  Component Value Date   WBC 8.1 03/07/2023   HGB 15.2 03/07/2023   HCT 45.9 (H) 03/07/2023   MCV 86.9 03/07/2023   PLT 275 03/07/2023   No results found for: "IRON", "TIBC", "FERRITIN"  Attestation Statements:   Reviewed by clinician on day of visit: allergies, medications, problem list, medical history, surgical history, family history, social history, and previous encounter notes.   I, Burt Knack, am acting as transcriptionist for Quillian Quince, MD.  I have reviewed the above documentation for accuracy and completeness, and I agree with the above. -  Quillian Quince, MD

## 2023-08-14 ENCOUNTER — Other Ambulatory Visit (HOSPITAL_COMMUNITY): Payer: Self-pay

## 2023-08-14 ENCOUNTER — Other Ambulatory Visit: Payer: Self-pay

## 2023-09-24 ENCOUNTER — Encounter: Payer: Self-pay | Admitting: Family Medicine

## 2023-09-24 ENCOUNTER — Ambulatory Visit (INDEPENDENT_AMBULATORY_CARE_PROVIDER_SITE_OTHER): Payer: 59 | Admitting: Family Medicine

## 2023-09-24 VITALS — BP 122/80 | HR 80 | Temp 98.5°F | Ht 63.0 in | Wt 179.4 lb

## 2023-09-24 DIAGNOSIS — R1033 Periumbilical pain: Secondary | ICD-10-CM

## 2023-09-24 NOTE — Progress Notes (Signed)
Subjective:    Patient ID: Karen [name], female    DOB: 1976-10-13, 47 y.o.   MRN: [id number] Patient is a very pleasant 47 year old Caucasian female who is currently on Zepbound for weight loss.  She has had 2 episodes of abdominal pain.  The pain was located in the epigastric area.  It lasted for almost 8 hours.  It radiated slightly upward into the right.  She has a past medical history of a cholecystectomy many years ago.  She denies any alcohol use.  She has no history of pancreatitis.  She was on pantoprazole but she stopped that over a year ago.  She denies any acid reflux.  She denies any melena or hematochezia.  She is currently on her period and she took 2 pregnancy test that were negative.  She denies any hematemesis.  She denies any refills.  She has been some constipated lately.  Past Medical History:  Diagnosis Date   Anxiety    Depression    Gallbladder problem    GERD (gastroesophageal reflux disease)    Gluten intolerance    Heartburn    High cholesterol    Hypothyroidism    subclinical/borderline   Lactose intolerance    Lactose intolerance    Palpitations    Prediabetes    Smoker 02/23/2013   Smoker    SOBOE (shortness of breath on exertion)    Thyroid disease    Phreesia 03/07/2020   Vitamin D deficiency    Past Surgical History:  Procedure Laterality Date   BREAST EXCISIONAL BIOPSY Right 2021   Complex sclerosing lesion.  - Biopsy site.  - Fibrocystic change.   BREAST LUMPECTOMY WITH RADIOACTIVE SEED LOCALIZATION Right 07/20/2020   Procedure: RIGHT BREAST LUMPECTOMY WITH RADIOACTIVE SEED LOCALIZATION;  Surgeon: Harriette Bouillon, MD;  Location: [facility];  Service: General;  Laterality: Right;   CHOLECYSTECTOMY N/A    age 47   CHOLECYSTECTOMY, LAPAROSCOPIC      Current Outpatient Medications on File Prior to Visit  Medication Sig Dispense Refill   aspirin EC 81 MG tablet Take 81 mg by mouth daily.     buPROPion (WELLBUTRIN SR) 150 MG  12 hr tablet Take 1 tablet (150 mg total) by mouth every morning. 90 tablet 0   fish oil-omega-3 fatty acids 1000 MG capsule Take 1,000 mg by mouth daily.      levothyroxine (SYNTHROID) 88 MCG tablet Take 1 tablet (88 mcg total) by mouth daily. 90 tablet 3   magnesium gluconate (MAGONATE) 500 MG tablet Take 500 mg by mouth 2 (two) times daily.     Multiple Vitamin (MULTIVITAMIN ADULT PO) Take by mouth. 14,000 IU weekly     tirzepatide (ZEPBOUND) 10 MG/0.5ML Pen Inject 10 mg into the skin once a week. 2 mL 0   VITAMIN D PO Take by mouth.     pantoprazole (PROTONIX) 40 MG tablet Take 1 tablet (40 mg total) by mouth daily before breakfast. (Patient not taking: Reported on 09/24/2023) 90 tablet 3   No current facility-administered medications on file prior to visit.   Allergies  Allergen Reactions   Penicillins Hives   Social History   Socioeconomic History   Marital status: Married    Spouse name: Merianne Almaraz   Number of children: 3   Years of education: Not on file   Highest education level: Not on file  Occupational History   Not on file  Tobacco Use   Smoking status: Former    Current  packs/day: 0.00    Types: Cigarettes    Quit date: 2017    Years since quitting: 7.9    Passive exposure: Past   Smokeless tobacco: Former    Quit date: 03/25/2016  Vaping Use   Vaping status: Former   Quit date: 03/25/2016  Substance and Sexual Activity   Alcohol use: Not Currently    Alcohol/week: 0.0 standard drinks of alcohol    Comment: Rare-maybe once a year   Drug use: No   Sexual activity: Yes  Other Topics Concern   Not on file  Social History Narrative   Not on file   Social Determinants of Health   Financial Resource Strain: Not on file  Food Insecurity: Not on file  Transportation Needs: Not on file  Physical Activity: Not on file  Stress: Not on file  Social Connections: Not on file  Intimate Partner Violence: Not on file   Family History  Problem Relation Age of  Onset   Diabetes Mother    Hyperlipidemia Mother    Hypertension Mother    Anxiety disorder Mother    Obesity Mother    Anxiety disorder Father    Hypertension Father    Depression Father    Colon cancer Neg Hx      Review of Systems     Objective:   Physical Exam Constitutional:      General: She is not in acute distress.    Appearance: She is well-developed. She is not diaphoretic.  HENT:     Head: Normocephalic and atraumatic.     Right Ear: External ear normal.     Left Ear: External ear normal.     Nose: Nose normal.     Mouth/Throat:     Pharynx: No oropharyngeal exudate.  Eyes:     General: No scleral icterus.       Right eye: No discharge.        Left eye: No discharge.     Conjunctiva/sclera: Conjunctivae normal.     Pupils: Pupils are equal, round, and reactive to light.  Neck:     Thyroid: No thyromegaly.     Vascular: No JVD.     Trachea: No tracheal deviation.  Cardiovascular:     Rate and Rhythm: Normal rate and regular rhythm.     Heart sounds: Normal heart sounds. No murmur heard.    No friction rub. No gallop.  Pulmonary:     Effort: Pulmonary effort is normal. No respiratory distress.     Breath sounds: Normal breath sounds. No stridor. No wheezing or rales.  Chest:     Chest wall: No tenderness.  Abdominal:     General: Bowel sounds are normal. There is no distension.     Palpations: Abdomen is soft.     Tenderness: There is no abdominal tenderness. There is no guarding or rebound.  Musculoskeletal:        General: No tenderness or deformity. Normal range of motion.     Cervical back: Normal range of motion and neck supple.  Lymphadenopathy:     Cervical: No cervical adenopathy.  Skin:    General: Skin is warm.     Coloration: Skin is not pale.  Neurological:     Mental Status: She is alert and oriented to person, place, and time.     Cranial Nerves: No cranial nerve deficit.     Motor: No abnormal muscle tone.     Coordination:  Coordination normal.     Deep  Tendon Reflexes: Reflexes are normal and symmetric.  Psychiatric:        Behavior: Behavior normal.        Thought Content: Thought content normal.        Judgment: Judgment normal.           Assessment & Plan:  Periumbilical abdominal pain - Plan: CBC with Differential/Platelet, COMPLETE METABOLIC PANEL WITH GFR, Lipase Patient's abdominal exam today is completely benign and shows no evidence of an acute abdomen.  I believe that likely this could be right-sided abdominal pain secondary to acid irritating the lining of the stomach or small intestine.  This could be exacerbated due to Zepbound.  Therefore I want the patient to resume pantoprazole 40 mg a day as a test to see if this helps keep the pain away.  I would also recommend that she start taking a stool softener to facilitate daily bowel movements in case abdominal pain secondary to constipation was the cause.  If the pain does not return over the next 2 to 3 weeks, we will try to decide whether the stool softener or the pantoprazole is the improving agent.  Check CBC, CMP, lipase to evaluate for any potential causes if labs are abnormal I would recommend imaging of the abdomen and pelvis.

## 2023-09-25 LAB — COMPLETE METABOLIC PANEL WITH GFR
AG Ratio: 1.5 (calc) (ref 1.0–2.5)
ALT: 9 U/L (ref 6–29)
AST: 11 U/L (ref 10–35)
Albumin: 4.1 g/dL (ref 3.6–5.1)
Alkaline phosphatase (APISO): 63 U/L (ref 31–125)
BUN: 10 mg/dL (ref 7–25)
CO2: 23 mmol/L (ref 20–32)
Calcium: 9.4 mg/dL (ref 8.6–10.2)
Chloride: 110 mmol/L (ref 98–110)
Creat: 0.65 mg/dL (ref 0.50–0.99)
Globulin: 2.8 g/dL (ref 1.9–3.7)
Glucose, Bld: 101 mg/dL — ABNORMAL HIGH (ref 65–99)
Potassium: 4 mmol/L (ref 3.5–5.3)
Sodium: 140 mmol/L (ref 135–146)
Total Bilirubin: 0.3 mg/dL (ref 0.2–1.2)
Total Protein: 6.9 g/dL (ref 6.1–8.1)
eGFR: 109 mL/min/{1.73_m2} (ref >=60)

## 2023-09-25 LAB — CBC WITH DIFFERENTIAL/PLATELET
Absolute Lymphocytes: 2739 {cells}/uL (ref 850–3900)
Absolute Monocytes: 576 {cells}/uL (ref 200–950)
Basophils Absolute: 19 {cells}/uL (ref 0–200)
Basophils Relative: 0.3 %
Eosinophils Absolute: 198 {cells}/uL (ref 15–500)
Eosinophils Relative: 3.1 %
HCT: 46.6 % — ABNORMAL HIGH (ref 35.0–45.0)
Hemoglobin: 15.6 g/dL — ABNORMAL HIGH (ref 11.7–15.5)
MCH: 29.3 pg (ref 27.0–33.0)
MCHC: 33.5 g/dL (ref 32.0–36.0)
MCV: 87.6 fL (ref 80.0–100.0)
MPV: 10.5 fL (ref 7.5–12.5)
Monocytes Relative: 9 %
Neutro Abs: 2867 {cells}/uL (ref 1500–7800)
Neutrophils Relative %: 44.8 %
Platelets: 266 10*3/uL (ref 140–400)
RBC: 5.32 10*6/uL — ABNORMAL HIGH (ref 3.80–5.10)
RDW: 13.2 % (ref 11.0–15.0)
Total Lymphocyte: 42.8 %
WBC: 6.4 10*3/uL (ref 3.8–10.8)

## 2023-09-25 LAB — LIPASE: Lipase: 41 U/L (ref 7–60)

## 2023-09-27 ENCOUNTER — Ambulatory Visit: Payer: 59 | Admitting: Family Medicine

## 2023-10-07 ENCOUNTER — Ambulatory Visit: Payer: 59 | Admitting: Family Medicine

## 2023-10-21 ENCOUNTER — Ambulatory Visit (INDEPENDENT_AMBULATORY_CARE_PROVIDER_SITE_OTHER): Payer: 59 | Admitting: Family Medicine

## 2023-10-23 ENCOUNTER — Ambulatory Visit (INDEPENDENT_AMBULATORY_CARE_PROVIDER_SITE_OTHER): Payer: 59 | Admitting: Family Medicine

## 2023-10-23 ENCOUNTER — Encounter (INDEPENDENT_AMBULATORY_CARE_PROVIDER_SITE_OTHER): Payer: Self-pay | Admitting: Family Medicine

## 2023-10-23 VITALS — BP 132/79 | HR 67 | Temp 98.5°F | Ht 63.0 in | Wt 174.0 lb

## 2023-10-23 DIAGNOSIS — R109 Unspecified abdominal pain: Secondary | ICD-10-CM | POA: Diagnosis not present

## 2023-10-23 DIAGNOSIS — R632 Polyphagia: Secondary | ICD-10-CM

## 2023-10-23 DIAGNOSIS — E782 Mixed hyperlipidemia: Secondary | ICD-10-CM

## 2023-10-23 DIAGNOSIS — R7303 Prediabetes: Secondary | ICD-10-CM | POA: Diagnosis not present

## 2023-10-23 DIAGNOSIS — Z6830 Body mass index (BMI) 30.0-30.9, adult: Secondary | ICD-10-CM | POA: Diagnosis not present

## 2023-10-23 DIAGNOSIS — E559 Vitamin D deficiency, unspecified: Secondary | ICD-10-CM

## 2023-10-23 DIAGNOSIS — E669 Obesity, unspecified: Secondary | ICD-10-CM | POA: Diagnosis not present

## 2023-10-23 DIAGNOSIS — Z6831 Body mass index (BMI) 31.0-31.9, adult: Secondary | ICD-10-CM

## 2023-10-23 NOTE — Progress Notes (Signed)
 .smr  Office: [phone]  /  Fax: [phone]  WEIGHT SUMMARY AND BIOMETRICS  Anthropometric Measurements Height: 5' 3 (1.6 m) Weight: 174 lb (78.9 kg) BMI (Calculated): 30.83 Weight at Last Visit: 179 lb Weight Lost Since Last Visit: 5 lb Weight Gained Since Last Visit: 0 Starting Weight: 224 lb Total Weight Loss (lbs): 30 lb (13.6 kg)   Body Composition  Body Fat %: 42.8 % Fat Mass (lbs): 74.8 lbs Muscle Mass (lbs): 95 lbs Total Body Water (lbs): 69.2 lbs Visceral Fat Rating : 9   Other Clinical Data Fasting: No Labs: Yes Today's Visit #: 17 Starting Date: 05/31/22    Chief Complaint: OBESITY   History of Present Illness   The patient, with a history of prediabetes, polyphasia, obesity, and vitamin D  deficiency, presents for a follow-up consultation. She has been managing her prediabetes with diet, exercise, and weight loss, and has seen some success in these areas. Her last hemoglobin A1c was 5.8, fasting glucose was 101, and fasting insulin  was 20.7. She has been taking Zepbound  10mg  for polyphasia and has reported good control of symptoms. Over the past three months, she has lost five pounds and has been adhering to her category two eating plan about 70% of the time. However, she has not been exercising regularly.  The patient experienced abdominal pain last month, which her primary care provider suspected to be a possible ulcer or partial ileus. She was prescribed Miralax and Protonix , which alleviated the pain. However, she stopped taking Wellbutrin  due to constipation. She is unsure if stopping Wellbutrin  has affected her cravings due to the hectic nature of the past few months.  The patient has been experiencing concerns about her cholesterol levels. Despite being on a protein diet and taking fish oil, she is hesitant to start statin therapy. She has been wearing ankle weights as a form of exercise and is considering using the gym at her workplace. She has also  been taking a weekly supplement of 25,000 IU of vitamin D  during the winter months.          PHYSICAL EXAM:  Blood pressure 132/79, pulse 67, temperature 98.5 F (36.9 C), height 5' 3 (1.6 m), weight 174 lb (78.9 kg), SpO2 99%. Body mass index is 30.82 kg/m.  DIAGNOSTIC DATA REVIEWED:  BMET    Component Value Date/Time   NA 140 09/24/2023 0851   K 4.0 09/24/2023 0851   CL 110 09/24/2023 0851   CO2 23 09/24/2023 0851   GLUCOSE 101 (H) 09/24/2023 0851   BUN 10 09/24/2023 0851   CREATININE 0.65 09/24/2023 0851   CALCIUM 9.4 09/24/2023 0851   GFRNONAA 105 04/06/2021 1113   GFRAA 121 04/06/2021 1113   Lab Results  Component Value Date   HGBA1C 5.8 (H) 03/07/2023   HGBA1C 5.2 11/13/2021   Lab Results  Component Value Date   INSULIN  20.7 05/31/2022   Lab Results  Component Value Date   TSH 2.55 03/07/2023   CBC    Component Value Date/Time   WBC 6.4 09/24/2023 0851   RBC 5.32 (H) 09/24/2023 0851   HGB 15.6 (H) 09/24/2023 0851   HGB 15.7 05/31/2022 1156   HCT 46.6 (H) 09/24/2023 0851   HCT 46.2 05/31/2022 1156   PLT 266 09/24/2023 0851   PLT 268 05/31/2022 1156   MCV 87.6 09/24/2023 0851   MCV 87 05/31/2022 1156   MCH 29.3 09/24/2023 0851   MCHC 33.5 09/24/2023 0851   RDW 13.2 09/24/2023 0851   RDW  14.1 05/31/2022 1156   Iron Studies No results found for: IRON, TIBC, FERRITIN, IRONPCTSAT Lipid Panel     Component Value Date/Time   CHOL 214 (H) 03/07/2023 0857   TRIG 105 03/07/2023 0857   HDL 46 (L) 03/07/2023 0857   CHOLHDL 4.7 03/07/2023 0857   VLDL 25 12/27/2016 1115   LDLCALC 146 (H) 03/07/2023 0857   Hepatic Function Panel     Component Value Date/Time   PROT 6.9 09/24/2023 0851   ALBUMIN 3.9 12/27/2016 1115   AST 11 09/24/2023 0851   ALT 9 09/24/2023 0851   ALKPHOS 73 12/27/2016 1115   BILITOT 0.3 09/24/2023 0851      Component Value Date/Time   TSH 2.55 03/07/2023 0857   Nutritional Lab Results  Component Value Date    VD25OH 63 03/07/2023   VD25OH 60 03/16/2014     Assessment and Plan    Abdominal Pain Abdominal pain likely due to ulcer, gastritis or constipation. MiraLAX and Protonix  alleviated symptoms. Discussed the mechanism of action and importance of regular use. Potential need for scope and H. pylori test if pain recurs. - Continue Miralax daily - Continue Protonix  - Consider scope and H. pylori test if pain recurs  Polyphagia Polyphagia managed with Zepbound  10 mg, patient doing well. - Continue Zepbound  10 mg, taking 5 mg every two weeks  Prediabetes Elevated hemoglobin A1c (5.8) and fasting glucose (101). Patient working on diet, exercise, and weight loss to prevent diabetes. Emphasized importance of lifestyle changes. - Continue current diet and exercise regimen - Monitor hemoglobin A1c and fasting glucose levels regularly  Obesity Patient lost five pounds in three months, following category two eating plan 70% of the time, not exercising. Discussed benefits of regular exercise for weight loss and health. - Encourage continuation of current eating plan - Recommend starting an exercise routine, possibly using a gym at work or incorporating ankle weights  Mixed Hyperlipidemia Triglycerides well-controlled (105), elevated LDL, slightly low HDL (46). Patient on high-protein diet and fish oil. Discussed diet, exercise, and genetic factors. Encouraged fish consumption and monitoring high-fat cheese intake. - Continue fish oil - Encourage consumption of fish twice a week - Recommend monitoring and possibly reducing intake of high-fat cheeses - Encourage exercise to increase HDL levels  Vitamin D  Deficiency Vitamin D  deficiency managed with 25,000 IU weekly during winter. Discussed monitoring to avoid toxicity and ensure adequate supplementation. - Order vitamin D  level test - Continue current vitamin D  supplementation  Follow-up - Schedule follow-up appointment in eight weeks - Review  lab results and adjust treatment as necessary.          She was informed of the importance of frequent follow up visits to maximize her success with intensive lifestyle modifications for her multiple health conditions.    Louann Penton, MD

## 2023-10-24 LAB — VITAMIN D 25 HYDROXY (VIT D DEFICIENCY, FRACTURES): Vit D, 25-Hydroxy: 51.2 ng/mL (ref 30.0–100.0)

## 2023-10-29 ENCOUNTER — Other Ambulatory Visit: Payer: Self-pay

## 2023-12-19 ENCOUNTER — Ambulatory Visit (INDEPENDENT_AMBULATORY_CARE_PROVIDER_SITE_OTHER): Payer: Commercial Managed Care - PPO | Admitting: Family Medicine

## 2023-12-23 ENCOUNTER — Ambulatory Visit (INDEPENDENT_AMBULATORY_CARE_PROVIDER_SITE_OTHER): Admitting: Family Medicine

## 2023-12-23 ENCOUNTER — Encounter (INDEPENDENT_AMBULATORY_CARE_PROVIDER_SITE_OTHER): Payer: Self-pay | Admitting: Family Medicine

## 2023-12-23 VITALS — BP 120/77 | HR 71 | Temp 98.1°F | Ht 63.0 in | Wt 176.0 lb

## 2023-12-23 DIAGNOSIS — F3289 Other specified depressive episodes: Secondary | ICD-10-CM

## 2023-12-23 DIAGNOSIS — E669 Obesity, unspecified: Secondary | ICD-10-CM | POA: Diagnosis not present

## 2023-12-23 DIAGNOSIS — Z6831 Body mass index (BMI) 31.0-31.9, adult: Secondary | ICD-10-CM

## 2023-12-23 DIAGNOSIS — R7303 Prediabetes: Secondary | ICD-10-CM | POA: Diagnosis not present

## 2023-12-23 DIAGNOSIS — R632 Polyphagia: Secondary | ICD-10-CM

## 2023-12-23 MED ORDER — BUPROPION HCL ER (SR) 150 MG PO TB12
150.0000 mg | ORAL_TABLET | Freq: Every morning | ORAL | 0 refills | Status: DC
  Filled 2023-12-23: qty 30, 30d supply, fill #0

## 2023-12-23 MED ORDER — ZEPBOUND 10 MG/0.5ML ~~LOC~~ SOAJ
10.0000 mg | SUBCUTANEOUS | 0 refills | Status: DC
  Filled 2023-12-23 – 2024-01-03 (×2): qty 2, 28d supply, fill #0

## 2023-12-23 NOTE — Progress Notes (Signed)
 Office: [phone]  /  Fax: [phone]  WEIGHT SUMMARY AND BIOMETRICS  Anthropometric Measurements Height: 5\' 3"  (1.6 m) Weight: 176 lb (79.8 kg) BMI (Calculated): 31.18 Weight at Last Visit: 174 lb Weight Lost Since Last Visit: 0 Weight Gained Since Last Visit: 2 lb Starting Weight: 224 lb Total Weight Loss (lbs): 28 lb (12.7 kg)   Body Composition  Body Fat %: 43.5 % Fat Mass (lbs): 76.8 lbs Muscle Mass (lbs): 94.8 lbs Total Body Water (lbs): 73.4 lbs Visceral Fat Rating : 10   Other Clinical Data Fasting: No Labs: No Today's Visit #: 18 Starting Date: 05/31/22    Chief Complaint: OBESITY   Discussed the use of AI scribe software for clinical note transcription with the patient, who gave verbal consent to proceed.  History of Present Illness   The patient presents to review her obesity treatment plan and follow up on her progress.  She follows her category two eating plan 75% of the time and is not currently exercising. She has gained two pounds over the last two months since her last visit. She is prescribed Zepbound, 10 mg weekly, but she opens up the pen and was taking 5mg  every 2 weeks. She adjusts the medication herself due to cost concerns, which allows her to afford the treatment. She knows this is not recommended by me or the drug company, and runs the risk of injecting an incorrect dose. She is unable to afford the medication any other way and has chosen to take this risk on her own.  She has a history of prediabetes and is attempting to manage it through diet, exercise, and weight loss. She is not currently taking metformin.  She is diagnosed with emotional eating behaviors and is being treated with Wellbutrin SR, 150 mg every morning. She recently restarted Wellbutrin about a week ago and has noticed a reduction in her cravings, particularly in the evening, which she attributes to stress and seeing others eat.  She works night shifts, which affects  her eating schedule. Her 24 year old son is learning to bake, which presents additional challenges to her dietary adherence.          PHYSICAL EXAM:  Blood pressure 120/77, pulse 71, temperature 98.1 F (36.7 C), height 5\' 3"  (1.6 m), weight 176 lb (79.8 kg), SpO2 99%. Body mass index is 31.18 kg/m.  DIAGNOSTIC DATA REVIEWED:  BMET    Component Value Date/Time   NA 140 09/24/2023 0851   K 4.0 09/24/2023 0851   CL 110 09/24/2023 0851   CO2 23 09/24/2023 0851   GLUCOSE 101 (H) 09/24/2023 0851   BUN 10 09/24/2023 0851   CREATININE 0.65 09/24/2023 0851   CALCIUM 9.4 09/24/2023 0851   GFRNONAA 105 04/06/2021 1113   GFRAA 121 04/06/2021 1113   Lab Results  Component Value Date   HGBA1C 5.8 (H) 03/07/2023   HGBA1C 5.2 11/13/2021   Lab Results  Component Value Date   INSULIN 20.7 05/31/2022   Lab Results  Component Value Date   TSH 2.55 03/07/2023   CBC    Component Value Date/Time   WBC 6.4 09/24/2023 0851   RBC 5.32 (H) 09/24/2023 0851   HGB 15.6 (H) 09/24/2023 0851   HGB 15.7 05/31/2022 1156   HCT 46.6 (H) 09/24/2023 0851   HCT 46.2 05/31/2022 1156   PLT 266 09/24/2023 0851   PLT 268 05/31/2022 1156   MCV 87.6 09/24/2023 0851   MCV 87 05/31/2022 1156   MCH 29.3 09/24/2023 0851  MCHC 33.5 09/24/2023 0851   RDW 13.2 09/24/2023 0851   RDW 14.1 05/31/2022 1156   Iron Studies No results found for: "IRON", "TIBC", "FERRITIN", "IRONPCTSAT" Lipid Panel     Component Value Date/Time   CHOL 214 (H) 03/07/2023 0857   TRIG 105 03/07/2023 0857   HDL 46 (L) 03/07/2023 0857   CHOLHDL 4.7 03/07/2023 0857   VLDL 25 12/27/2016 1115   LDLCALC 146 (H) 03/07/2023 0857   Hepatic Function Panel     Component Value Date/Time   PROT 6.9 09/24/2023 0851   ALBUMIN 3.9 12/27/2016 1115   AST 11 09/24/2023 0851   ALT 9 09/24/2023 0851   ALKPHOS 73 12/27/2016 1115   BILITOT 0.3 09/24/2023 0851      Component Value Date/Time   TSH 2.55 03/07/2023 0857    Nutritional Lab Results  Component Value Date   VD25OH 51.2 10/23/2023   VD25OH 63 03/07/2023   VD25OH 60 03/16/2014     Assessment and Plan    Obesity and Polyphagia Patient has gained two pounds over the last two months. She follows her category two eating plan 75% of the time and is not currently exercising. She would like to take the Zepbound @2 .5 mg weekly due to cost and insurance issues. Discussed potential benefits of adding metformin to enhance weight loss based on studies showing improved outcomes with this combination. - Zepbound 2.5 mg weekly - Monitor weight and progress - Discuss potential addition of metformin if needed - Follow up in one month - Monitor for effectiveness and side effects, especially noting the risk of incorrect dosing.   Emotional Eating Behaviors Patient is treated with Wellbutrin SR 150 mg every morning. She recently restarted Wellbutrin and has noticed a reduction in cravings and snacking. Discussed that it takes about two weeks to feel the full effects of Wellbutrin and that dosage adjustments can be made if necessary. - Continue Wellbutrin SR 150 mg every morning - Reassess in one month to determine if dosage adjustment is needed  Prediabetes Patient is managing prediabetes with diet, exercise, and weight loss. She is not currently on metformin. Discussed potential benefits of adding metformin if current measures are insufficient. - Consider adding metformin if Wellbutrin and current plan are insufficient  General Health Maintenance Patient is following a category two eating plan and has identified evening snacking as a challenge. She is working on strategies to manage stress and cravings, including the use of Wellbutrin to help reduce snacking. - Continue category two eating plan - Implement strategies to manage evening snacking and stress eating  Follow-up - Follow up in one month - Send MyChart message if any issues arise.          I have personally spent 45 minutes total time today in preparation, patient care, and documentation for this visit, including the following: review of clinical lab tests; review of medical tests/procedures/services.    She was informed of the importance of frequent follow up visits to maximize her success with intensive lifestyle modifications for her multiple health conditions.    Quillian Quince, MD

## 2023-12-24 ENCOUNTER — Other Ambulatory Visit (HOSPITAL_COMMUNITY): Payer: Self-pay

## 2024-01-03 ENCOUNTER — Other Ambulatory Visit: Payer: Self-pay

## 2024-01-03 ENCOUNTER — Other Ambulatory Visit (HOSPITAL_COMMUNITY): Payer: Self-pay

## 2024-01-22 ENCOUNTER — Other Ambulatory Visit (HOSPITAL_COMMUNITY): Payer: Self-pay

## 2024-01-22 ENCOUNTER — Encounter (INDEPENDENT_AMBULATORY_CARE_PROVIDER_SITE_OTHER): Payer: Self-pay | Admitting: Family Medicine

## 2024-01-22 ENCOUNTER — Ambulatory Visit (INDEPENDENT_AMBULATORY_CARE_PROVIDER_SITE_OTHER): Admitting: Family Medicine

## 2024-01-22 ENCOUNTER — Other Ambulatory Visit: Payer: Self-pay

## 2024-01-22 VITALS — BP 138/82 | HR 65 | Temp 98.1°F | Ht 63.0 in | Wt 172.0 lb

## 2024-01-22 DIAGNOSIS — E669 Obesity, unspecified: Secondary | ICD-10-CM | POA: Diagnosis not present

## 2024-01-22 DIAGNOSIS — Z6830 Body mass index (BMI) 30.0-30.9, adult: Secondary | ICD-10-CM

## 2024-01-22 DIAGNOSIS — F5089 Other specified eating disorder: Secondary | ICD-10-CM | POA: Diagnosis not present

## 2024-01-22 DIAGNOSIS — R632 Polyphagia: Secondary | ICD-10-CM | POA: Diagnosis not present

## 2024-01-22 DIAGNOSIS — F3289 Other specified depressive episodes: Secondary | ICD-10-CM

## 2024-01-22 MED ORDER — BUPROPION HCL ER (SR) 200 MG PO TB12
200.0000 mg | ORAL_TABLET | Freq: Every day | ORAL | 0 refills | Status: DC
  Filled 2024-01-22: qty 30, 30d supply, fill #0

## 2024-01-22 NOTE — Progress Notes (Signed)
 Office: [phone]  /  Fax: [phone]  WEIGHT SUMMARY AND BIOMETRICS  Anthropometric Measurements Height: 5\' 3"  (1.6 m) Weight: 172 lb (78 kg) BMI (Calculated): 30.48 Weight at Last Visit: 176 lb Weight Lost Since Last Visit: 4 lb Weight Gained Since Last Visit: 0 Starting Weight: 224 lb Total Weight Loss (lbs): 32 lb (14.5 kg)   Body Composition  Body Fat %: 42.1 % Fat Mass (lbs): 72.6 lbs Muscle Mass (lbs): 94.8 lbs Total Body Water (lbs): 69.4 lbs Visceral Fat Rating : 9   Other Clinical Data Fasting: no Labs: no Today's Visit #: 19 Starting Date: 05/31/22    Chief Complaint: OBESITY   History of Present Illness Karen [name] is a 48 year old female who presents for obesity treatment and progress assessment.  She is adhering to her category two eating plan 85% of the time and has achieved a weight loss of four pounds over the past month. She is not currently engaging in regular exercise but intends to increase her physical activity with the arrival of spring.  She is taking Zepbound at 10 mg weekly to manage polyphagia, which has effectively improved her hunger control. She experiences a slight dry mouth as a side effect but finds it manageable. She has been intermittently using GLP-1 medications for approximately seven years and currently has a four-month supply of Zepbound. She does not experience gastrointestinal issues with this medication and does not feel queasy if she goes too long without eating.  She is contemplating increasing her Wellbutrin dosage to better manage anxiety and emotional eating. She currently experiences anxiety that leads to comfort eating, particularly during stressful periods.  She has been working 84 hours over nine days, which has affected her sleep, but she now has ten days off to recuperate. Despite the demanding work schedule, she describes her stress level as manageable.  She is preparing for Passover and plans to make  baklava, acknowledging it as a high-calorie temptation. She intends to limit her indulgence to one piece and maintain her dietary discipline otherwise.      PHYSICAL EXAM:  Blood pressure 138/82, pulse 65, temperature 98.1 F (36.7 C), height 5\' 3"  (1.6 m), weight 172 lb (78 kg), last menstrual period 12/22/2023, SpO2 98%. Body mass index is 30.47 kg/m.  DIAGNOSTIC DATA REVIEWED:  BMET    Component Value Date/Time   NA 140 09/24/2023 0851   K 4.0 09/24/2023 0851   CL 110 09/24/2023 0851   CO2 23 09/24/2023 0851   GLUCOSE 101 (H) 09/24/2023 0851   BUN 10 09/24/2023 0851   CREATININE 0.65 09/24/2023 0851   CALCIUM 9.4 09/24/2023 0851   GFRNONAA 105 04/06/2021 1113   GFRAA 121 04/06/2021 1113   Lab Results  Component Value Date   HGBA1C 5.8 (H) 03/07/2023   HGBA1C 5.2 11/13/2021   Lab Results  Component Value Date   INSULIN 20.7 05/31/2022   Lab Results  Component Value Date   TSH 2.55 03/07/2023   CBC    Component Value Date/Time   WBC 6.4 09/24/2023 0851   RBC 5.32 (H) 09/24/2023 0851   HGB 15.6 (H) 09/24/2023 0851   HGB 15.7 05/31/2022 1156   HCT 46.6 (H) 09/24/2023 0851   HCT 46.2 05/31/2022 1156   PLT 266 09/24/2023 0851   PLT 268 05/31/2022 1156   MCV 87.6 09/24/2023 0851   MCV 87 05/31/2022 1156   MCH 29.3 09/24/2023 0851   MCHC 33.5 09/24/2023 0851   RDW 13.2 09/24/2023  0851   RDW 14.1 05/31/2022 1156   Iron Studies No results found for: "IRON", "TIBC", "FERRITIN", "IRONPCTSAT" Lipid Panel     Component Value Date/Time   CHOL 214 (H) 03/07/2023 0857   TRIG 105 03/07/2023 0857   HDL 46 (L) 03/07/2023 0857   CHOLHDL 4.7 03/07/2023 0857   VLDL 25 12/27/2016 1115   LDLCALC 146 (H) 03/07/2023 0857   Hepatic Function Panel     Component Value Date/Time   PROT 6.9 09/24/2023 0851   ALBUMIN 3.9 12/27/2016 1115   AST 11 09/24/2023 0851   ALT 9 09/24/2023 0851   ALKPHOS 73 12/27/2016 1115   BILITOT 0.3 09/24/2023 0851      Component Value  Date/Time   TSH 2.55 03/07/2023 0857   Nutritional Lab Results  Component Value Date   VD25OH 51.2 10/23/2023   VD25OH 63 03/07/2023   VD25OH 60 03/16/2014     Assessment and Plan Assessment & Plan Obesity Obesity management is the primary focus. She has lost four pounds in the last month, indicating progress with her weight loss plan. She adheres to her category two eating plan 85% of the time but is not currently exercising. She is concerned about potential temptations during Passover but plans to limit indulgence to one day. Emphasis is placed on maintaining a reasonable work schedule and increasing physical activity to prevent associated conditions like hypertension, diabetes, and hyperlipidemia. She is perimenopausal, which is a critical period for weight management to prevent metabolic complications. - Continue category two eating plan - Increase physical activity, consider using a treadmill or engaging in yard work - Monitor weight and dietary habits - Discuss strategies to manage temptations during holidays  Polyphagia Polyphagia is managed with Zepbound at 10 mg per week. She reports improvement in hunger control since resuming the medication. There is a known side effect of queasiness if meals are skipped, but she has not experienced this. - Continue Zepbound 10 mg weekly -   Emotional eating behaviors Emotional eating behaviors are addressed with Wellbutrin. She expresses a desire to increase the dose to help manage anxiety-related snacking. Increasing the dose from 150 mg to 200 mg is discussed, with the understanding that there is a 'sweet spot' for Wellbutrin where it helps with anxiety without exacerbating it. The potential for increased norepinephrine at higher doses to worsen anxiety is discussed with the patient - Increase Wellbutrin dose to 200 mg - Monitor for changes in anxiety and eating behaviors   She was informed of the importance of frequent follow up visits  to maximize her success with intensive lifestyle modifications for her multiple health conditions.    Quillian Quince, MD

## 2024-03-04 ENCOUNTER — Encounter (INDEPENDENT_AMBULATORY_CARE_PROVIDER_SITE_OTHER): Payer: Self-pay | Admitting: Family Medicine

## 2024-03-04 ENCOUNTER — Other Ambulatory Visit: Payer: Self-pay

## 2024-03-04 ENCOUNTER — Ambulatory Visit (INDEPENDENT_AMBULATORY_CARE_PROVIDER_SITE_OTHER): Admitting: Family Medicine

## 2024-03-04 VITALS — BP 125/89 | HR 77 | Temp 98.1°F | Ht 63.0 in | Wt 170.0 lb

## 2024-03-04 DIAGNOSIS — Z6830 Body mass index (BMI) 30.0-30.9, adult: Secondary | ICD-10-CM | POA: Diagnosis not present

## 2024-03-04 DIAGNOSIS — F3289 Other specified depressive episodes: Secondary | ICD-10-CM

## 2024-03-04 DIAGNOSIS — F5089 Other specified eating disorder: Secondary | ICD-10-CM

## 2024-03-04 DIAGNOSIS — E782 Mixed hyperlipidemia: Secondary | ICD-10-CM | POA: Diagnosis not present

## 2024-03-04 DIAGNOSIS — E785 Hyperlipidemia, unspecified: Secondary | ICD-10-CM | POA: Diagnosis not present

## 2024-03-04 DIAGNOSIS — E669 Obesity, unspecified: Secondary | ICD-10-CM | POA: Diagnosis not present

## 2024-03-04 DIAGNOSIS — E559 Vitamin D deficiency, unspecified: Secondary | ICD-10-CM | POA: Diagnosis not present

## 2024-03-04 DIAGNOSIS — E66812 Obesity, class 2: Secondary | ICD-10-CM

## 2024-03-04 DIAGNOSIS — R7303 Prediabetes: Secondary | ICD-10-CM | POA: Diagnosis not present

## 2024-03-04 MED ORDER — BUPROPION HCL ER (SR) 200 MG PO TB12
200.0000 mg | ORAL_TABLET | Freq: Every day | ORAL | 0 refills | Status: DC
  Filled 2024-03-04: qty 30, 30d supply, fill #0

## 2024-03-04 MED ORDER — BUPROPION HCL ER (SR) 200 MG PO TB12
200.0000 mg | ORAL_TABLET | Freq: Every day | ORAL | 0 refills | Status: DC
  Filled 2024-03-04: qty 90, 90d supply, fill #0

## 2024-03-04 NOTE — Progress Notes (Signed)
 Office: [phone]  /  Fax: [phone]  WEIGHT SUMMARY AND BIOMETRICS  Anthropometric Measurements Height: 5\' 3"  (1.6 m) Weight: 170 lb (77.1 kg) BMI (Calculated): 30.12 Weight at Last Visit: 172 lb Weight Lost Since Last Visit: 2 lb Weight Gained Since Last Visit: 0 Starting Weight: 224 lb Total Weight Loss (lbs): 54 lb (24.5 kg) Peak Weight: 245 lb   Body Composition  Body Fat %: 41.9 % Fat Mass (lbs): 71.2 lbs Muscle Mass (lbs): 93.8 lbs Total Body Water (lbs): 69.6 lbs Visceral Fat Rating : 9   Other Clinical Data Fasting: yes Labs: no Today's Visit #: 20 Starting Date: 05/31/22    Chief Complaint: OBESITY   History of Present Illness Karen [name] is a 48 year old female with obesity and emotional eating disorder who presents for obesity treatment plan assessment and progress evaluation.  She adheres to the category two eating plan approximately 80% of the time. Despite not engaging in regular exercise recently, she has lost two pounds in the last month. She faces challenges in maintaining her diet during holidays and celebrations, citing her anniversary, birthday, and Mother's Day as recent occasions where she indulged in cake.  She has a history of emotional eating disorder and is currently on Wellbutrin , for which she requests a refill. She previously ran out of her medication and used leftover 150 mg tablets to avoid going without. She is also taking Zepbound  10 mg weekly for polyphagia.  She has a history of prediabetes and is actively managing it by reducing simple carbohydrates in her diet. She is currently fasting and intends to eat after returning home. Her hunger issues are manageable, especially when meat is available, although she sometimes forgets to defrost it.  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 She enjoys spending time with her family and is planning a trip to  the mountains for hiking and fishing with her nephew and son.      PHYSICAL EXAM:  Blood pressure 125/89, pulse 77, temperature 98.1 F (36.7 C), height 5\' 3"  (1.6 m), weight 170 lb (77.1 kg), SpO2 98%. Body mass index is 30.11 kg/m.  DIAGNOSTIC DATA REVIEWED:  BMET    Component Value Date/Time   NA 140 09/24/2023 0851   K 4.0 09/24/2023 0851   CL 110 09/24/2023 0851   CO2 23 09/24/2023 0851   GLUCOSE 101 (H) 09/24/2023 0851   BUN 10 09/24/2023 0851   CREATININE 0.65 09/24/2023 0851   CALCIUM 9.4 09/24/2023 0851   GFRNONAA 105 04/06/2021 1113   GFRAA 121 04/06/2021 1113   Lab Results  Component Value Date   HGBA1C 5.8 (H) 03/07/2023   HGBA1C 5.2 11/13/2021   Lab Results  Component Value Date   INSULIN  20.7 05/31/2022   Lab Results  Component Value Date   TSH 2.55 03/07/2023   CBC    Component Value Date/Time   WBC 6.4 09/24/2023 0851   RBC 5.32 (H) 09/24/2023 0851   HGB 15.6 (H) 09/24/2023 0851   HGB 15.7 05/31/2022 1156   HCT 46.6 (H) 09/24/2023 0851   HCT 46.2 05/31/2022 1156   PLT 266 09/24/2023 0851   PLT 268 05/31/2022 1156   MCV 87.6 09/24/2023 0851   MCV 87 05/31/2022 1156   MCH 29.3 09/24/2023 0851   MCHC 33.5 09/24/2023 0851   RDW 13.2 09/24/2023 0851   RDW 14.1 05/31/2022 1156   Iron Studies No results found  for: "IRON", "TIBC", "FERRITIN", "IRONPCTSAT" Lipid Panel     Component Value Date/Time   CHOL 214 (H) 03/07/2023 0857   TRIG 105 03/07/2023 0857   HDL 46 (L) 03/07/2023 0857   CHOLHDL 4.7 03/07/2023 0857   VLDL 25 12/27/2016 1115   LDLCALC 146 (H) 03/07/2023 0857   Hepatic Function Panel     Component Value Date/Time   PROT 6.9 09/24/2023 0851   ALBUMIN 3.9 12/27/2016 1115   AST 11 09/24/2023 0851   ALT 9 09/24/2023 0851   ALKPHOS 73 12/27/2016 1115   BILITOT 0.3 09/24/2023 0851      Component Value Date/Time   TSH 2.55 03/07/2023 0857   Nutritional Lab Results  Component Value Date   VD25OH 51.2 10/23/2023    VD25OH 63 03/07/2023   VD25OH 60 03/16/2014     Assessment and Plan Assessment & Plan Obesity and prediabetes Obesity management with emphasis on dietary modifications and exercise. Adheres to category two eating plan 80% of the time, resulting in a two-pound weight loss over the past month. Challenges include emotional eating during celebrations and lack of exercise. Strategies to manage hunger and meal preparation, including using an Instapot, were discussed. She is also reducing simple carbohydrates to manage prediabetes. - Continue category two eating plan. - Encourage regular exercise. - Discuss meal preparation strategies, including using an Instapot. - Continue reducing simple carbohydrates to manage prediabetes.  EEB Emotional Eating Behaviors managed with bupropion . She previously ran out of medication but managed with leftover 150 mg tablets. No current issues with stress or sleep. - Refill bupropion  with a 90-day supply.  HLD She is working on diet/exercise/weight loss and is due for labs -Check labs  Vit D deficiency Due for labs -Check for labs    She was informed of the importance of frequent follow up visits to maximize her success with intensive lifestyle modifications for her multiple health conditions. FU 4-6 weeks   Jasmine Mesi, MD

## 2024-03-05 LAB — CMP14+EGFR
ALT: 10 IU/L (ref 0–32)
AST: 12 IU/L (ref 0–40)
Albumin: 4.3 g/dL (ref 3.9–4.9)
Alkaline Phosphatase: 73 IU/L (ref 44–121)
BUN/Creatinine Ratio: 14 (ref 9–23)
BUN: 10 mg/dL (ref 6–24)
Bilirubin Total: <0.2 mg/dL (ref 0.0–1.2)
CO2: 19 mmol/L — ABNORMAL LOW (ref 20–29)
Calcium: 9.4 mg/dL (ref 8.7–10.2)
Chloride: 107 mmol/L — ABNORMAL HIGH (ref 96–106)
Creatinine, Ser: 0.71 mg/dL (ref 0.57–1.00)
Globulin, Total: 2.4 g/dL (ref 1.5–4.5)
Glucose: 86 mg/dL (ref 70–99)
Potassium: 4 mmol/L (ref 3.5–5.2)
Sodium: 140 mmol/L (ref 134–144)
Total Protein: 6.7 g/dL (ref 6.0–8.5)
eGFR: 105 mL/min/{1.73_m2} (ref >59)

## 2024-03-05 LAB — INSULIN, RANDOM: INSULIN: 12.3 u[IU]/mL (ref 2.6–24.9)

## 2024-03-05 LAB — HEMOGLOBIN A1C
Est. average glucose Bld gHb Est-mCnc: 108 mg/dL
Hgb A1c MFr Bld: 5.4 % (ref 4.8–5.6)

## 2024-03-05 LAB — LIPID PANEL WITH LDL/HDL RATIO
Cholesterol, Total: 219 mg/dL — ABNORMAL HIGH (ref 100–199)
HDL: 51 mg/dL (ref >39)
LDL Chol Calc (NIH): 145 mg/dL — ABNORMAL HIGH (ref 0–99)
LDL/HDL Ratio: 2.8 ratio (ref 0.0–3.2)
Triglycerides: 129 mg/dL (ref 0–149)
VLDL Cholesterol Cal: 23 mg/dL (ref 5–40)

## 2024-03-05 LAB — VITAMIN B12: Vitamin B-12: 941 pg/mL (ref 232–1245)

## 2024-03-05 LAB — VITAMIN D 25 HYDROXY (VIT D DEFICIENCY, FRACTURES): Vit D, 25-Hydroxy: 74 ng/mL (ref 30.0–100.0)

## 2024-04-06 ENCOUNTER — Other Ambulatory Visit: Payer: Self-pay | Admitting: Family Medicine

## 2024-04-06 ENCOUNTER — Other Ambulatory Visit (HOSPITAL_COMMUNITY): Payer: Self-pay

## 2024-04-06 DIAGNOSIS — K219 Gastro-esophageal reflux disease without esophagitis: Secondary | ICD-10-CM

## 2024-04-06 DIAGNOSIS — E038 Other specified hypothyroidism: Secondary | ICD-10-CM

## 2024-04-06 DIAGNOSIS — R5383 Other fatigue: Secondary | ICD-10-CM

## 2024-04-06 MED ORDER — LEVOTHYROXINE SODIUM 88 MCG PO TABS
88.0000 ug | ORAL_TABLET | Freq: Every day | ORAL | 3 refills | Status: AC
  Filled 2024-04-06: qty 90, 90d supply, fill #0
  Filled 2024-07-28: qty 90, 90d supply, fill #1
  Filled 2024-11-19: qty 90, 90d supply, fill #2

## 2024-04-06 MED ORDER — PANTOPRAZOLE SODIUM 40 MG PO TBEC
40.0000 mg | DELAYED_RELEASE_TABLET | Freq: Every day | ORAL | 3 refills | Status: AC
Start: 2024-04-06 — End: ?
  Filled 2024-04-06: qty 90, 90d supply, fill #0
  Filled 2024-07-28: qty 90, 90d supply, fill #1

## 2024-04-14 ENCOUNTER — Encounter: Payer: Self-pay | Admitting: Family Medicine

## 2024-05-12 ENCOUNTER — Other Ambulatory Visit (HOSPITAL_COMMUNITY): Payer: Self-pay

## 2024-05-12 ENCOUNTER — Ambulatory Visit (INDEPENDENT_AMBULATORY_CARE_PROVIDER_SITE_OTHER): Admitting: Family Medicine

## 2024-05-12 ENCOUNTER — Encounter (INDEPENDENT_AMBULATORY_CARE_PROVIDER_SITE_OTHER): Payer: Self-pay | Admitting: Family Medicine

## 2024-05-12 ENCOUNTER — Other Ambulatory Visit: Payer: Self-pay

## 2024-05-12 VITALS — BP 138/81 | HR 73 | Temp 98.4°F | Ht 63.0 in | Wt 171.0 lb

## 2024-05-12 DIAGNOSIS — E782 Mixed hyperlipidemia: Secondary | ICD-10-CM

## 2024-05-12 DIAGNOSIS — Z6830 Body mass index (BMI) 30.0-30.9, adult: Secondary | ICD-10-CM | POA: Diagnosis not present

## 2024-05-12 DIAGNOSIS — R632 Polyphagia: Secondary | ICD-10-CM

## 2024-05-12 DIAGNOSIS — E785 Hyperlipidemia, unspecified: Secondary | ICD-10-CM | POA: Diagnosis not present

## 2024-05-12 DIAGNOSIS — E669 Obesity, unspecified: Secondary | ICD-10-CM | POA: Diagnosis not present

## 2024-05-12 DIAGNOSIS — F5089 Other specified eating disorder: Secondary | ICD-10-CM

## 2024-05-12 DIAGNOSIS — E66812 Obesity, class 2: Secondary | ICD-10-CM

## 2024-05-12 DIAGNOSIS — R609 Edema, unspecified: Secondary | ICD-10-CM

## 2024-05-12 DIAGNOSIS — F3289 Other specified depressive episodes: Secondary | ICD-10-CM

## 2024-05-12 MED ORDER — BUPROPION HCL ER (SR) 200 MG PO TB12
200.0000 mg | ORAL_TABLET | Freq: Every day | ORAL | 0 refills | Status: DC
Start: 2024-05-12 — End: 2024-08-19
  Filled 2024-05-12 – 2024-06-05 (×2): qty 90, 90d supply, fill #0

## 2024-05-12 MED ORDER — TIRZEPATIDE-WEIGHT MANAGEMENT 12.5 MG/0.5ML ~~LOC~~ SOAJ
12.5000 mg | SUBCUTANEOUS | 0 refills | Status: DC
  Filled 2024-05-12: qty 2, 28d supply, fill #0

## 2024-05-12 NOTE — Progress Notes (Signed)
 Office: [phone]  /  Fax: [phone]  WEIGHT SUMMARY AND BIOMETRICS  Anthropometric Measurements Height: 5' 3 (1.6 m) Weight: 171 lb (77.6 kg) BMI (Calculated): 30.3 Weight at Last Visit: 170 lb Weight Lost Since Last Visit: 0 Weight Gained Since Last Visit: 1 lb Starting Weight: 224 lb Total Weight Loss (lbs): 53 lb (24 kg) Peak Weight: 245 lb   Body Composition  Body Fat %: 41 % Fat Mass (lbs): 70.2 lbs Muscle Mass (lbs): 96 lbs Total Body Water (lbs): 71.8 lbs Visceral Fat Rating : 9   Other Clinical Data Fasting: no Labs: no Today's Visit #: 21 Starting Date: 05/31/22    Chief Complaint: OBESITY    History of Present Illness Karen [name] is a 48 year old female who presents for assessment of her obesity treatment and weight management.  She has been following a category two eating plan with approximately 80% adherence. Although she is not engaging in formal exercise, she increases her daily activity by using stairs and parking further away. Despite these efforts, she has gained one pound since her last visit a few months ago.  Her hunger levels are generally well-controlled, but she struggles with impulse eating at work, particularly when stressed or fatigued. A candy basket at work serves as a temptation.  She is currently on Zepbound  and wishes to increase her maintenance dose. She has not experienced any issues with the current dose and is using a $500 coupon for the medication.  She takes bupropion  200 mg in the evening.  She has been experiencing fluid retention, particularly in the summer heat, and acknowledges not always hydrating with the correct fluids. She recalls having headaches a few weeks ago, which may be related to dehydration.  She mentions a previous diagnosis of hyperlipidemia and is concerned about potential statin therapy. She believes her cholesterol issues are diet-driven and is focused on dietary management.       PHYSICAL EXAM:  Blood pressure 138/81, pulse 73, temperature 98.4 F (36.9 C), height 5' 3 (1.6 m), weight 171 lb (77.6 kg), SpO2 96%. Body mass index is 30.29 kg/m.  DIAGNOSTIC DATA REVIEWED:  BMET    Component Value Date/Time   NA 140 03/04/2024 1602   K 4.0 03/04/2024 1602   CL 107 (H) 03/04/2024 1602   CO2 19 (L) 03/04/2024 1602   GLUCOSE 86 03/04/2024 1602   GLUCOSE 101 (H) 09/24/2023 0851   BUN 10 03/04/2024 1602   CREATININE 0.71 03/04/2024 1602   CREATININE 0.65 09/24/2023 0851   CALCIUM 9.4 03/04/2024 1602   GFRNONAA 105 04/06/2021 1113   GFRAA 121 04/06/2021 1113   Lab Results  Component Value Date   HGBA1C 5.4 03/04/2024   HGBA1C 5.2 11/13/2021   Lab Results  Component Value Date   INSULIN  12.3 03/04/2024   INSULIN  20.7 05/31/2022   Lab Results  Component Value Date   TSH 2.55 03/07/2023   CBC    Component Value Date/Time   WBC 6.4 09/24/2023 0851   RBC 5.32 (H) 09/24/2023 0851   HGB 15.6 (H) 09/24/2023 0851   HGB 15.7 05/31/2022 1156   HCT 46.6 (H) 09/24/2023 0851   HCT 46.2 05/31/2022 1156   PLT 266 09/24/2023 0851   PLT 268 05/31/2022 1156   MCV 87.6 09/24/2023 0851   MCV 87 05/31/2022 1156   MCH 29.3 09/24/2023 0851   MCHC 33.5 09/24/2023 0851   RDW 13.2 09/24/2023 0851   RDW 14.1 05/31/2022 1156   Iron Studies  No results found for: IRON, TIBC, FERRITIN, IRONPCTSAT Lipid Panel     Component Value Date/Time   CHOL 219 (H) 03/04/2024 1602   TRIG 129 03/04/2024 1602   HDL 51 03/04/2024 1602   CHOLHDL 4.7 03/07/2023 0857   VLDL 25 12/27/2016 1115   LDLCALC 145 (H) 03/04/2024 1602   LDLCALC 146 (H) 03/07/2023 0857   Hepatic Function Panel     Component Value Date/Time   PROT 6.7 03/04/2024 1602   ALBUMIN 4.3 03/04/2024 1602   AST 12 03/04/2024 1602   ALT 10 03/04/2024 1602   ALKPHOS 73 03/04/2024 1602   BILITOT <0.2 03/04/2024 1602      Component Value Date/Time   TSH 2.55 03/07/2023 0857   Nutritional Lab  Results  Component Value Date   VD25OH 74.0 03/04/2024   VD25OH 51.2 10/23/2023   VD25OH 63 03/07/2023     Assessment and Plan Assessment & Plan Obesity AND emotional eating behaviors Obesity with emotional eating behaviors, currently on a category two eating plan with 80% adherence. No formal exercise routine, but increasing daily activities. Reports emotional eating triggers at work due to stress and availability of candy. Currently on Zepbound  and bupropion . Weight increased by one pound, but fat mass decreased. Discussed increasing Zepbound  dose to 12.5 mg if available, with consideration of insurance coverage and potential side effects such as nausea. Emphasized the importance of maintaining the eating plan and hydration. Discussed strategies to manage fluid retention and dehydration, including proper hydration with calorie-free, caffeine-free beverages. - Increase Zepbound  dose to 12.5 mg if available and tolerated. - Continue bupropion  200 mg in the evening. - Encourage adherence to category two eating plan. - Advise on hydration with calorie-free, caffeine-free beverages. - Encourage increased daily physical activity. - Provide protein pudding recipe for a low-calorie, high-protein dessert option.  Hyperlipidemia Hyperlipidemia, prefers dietary management over statin therapy. Currently following a low-cholesterol category two eating plan. Discussed potential genetic component and the possibility of a calcium score test to assess coronary artery plaque if needed. Emphasized the importance of dietary management and monitoring cholesterol levels. - Continue category two eating plan for cholesterol management. - Discuss potential calcium score test with primary care provider if needed.  Fluid retention Fluid retention likely due to summer heat causing blood vessel expansion and fluid shift into tissues. Advised on the importance of proper hydration to manage fluid retention and prevent  dehydration. Discussed signs of dehydration such as headaches and strategies to increase hydration with calorie-free, caffeine-free beverages. - Advise on proper hydration with calorie-free, caffeine-free beverages. - Monitor for signs of dehydration such as headaches.   She was informed of the importance of frequent follow up visits to maximize her success with intensive lifestyle modifications for her multiple health conditions.    Louann Penton, MD

## 2024-05-13 ENCOUNTER — Other Ambulatory Visit (HOSPITAL_COMMUNITY): Payer: Self-pay

## 2024-06-04 ENCOUNTER — Encounter: Payer: Self-pay | Admitting: Family Medicine

## 2024-06-04 ENCOUNTER — Ambulatory Visit: Payer: Self-pay | Admitting: Family Medicine

## 2024-06-04 VITALS — BP 126/62 | HR 65 | Temp 98.2°F | Ht 63.0 in | Wt 172.6 lb

## 2024-06-04 DIAGNOSIS — E7800 Pure hypercholesterolemia, unspecified: Secondary | ICD-10-CM

## 2024-06-04 DIAGNOSIS — E782 Mixed hyperlipidemia: Secondary | ICD-10-CM

## 2024-06-04 DIAGNOSIS — E038 Other specified hypothyroidism: Secondary | ICD-10-CM

## 2024-06-04 DIAGNOSIS — Z0000 Encounter for general adult medical examination without abnormal findings: Secondary | ICD-10-CM

## 2024-06-04 DIAGNOSIS — Z0001 Encounter for general adult medical examination with abnormal findings: Secondary | ICD-10-CM

## 2024-06-04 LAB — CBC WITH DIFFERENTIAL/PLATELET
Absolute Lymphocytes: 3215 {cells}/uL (ref 850–3900)
Absolute Monocytes: 616 {cells}/uL (ref 200–950)
Basophils Absolute: 40 {cells}/uL (ref 0–200)
Basophils Relative: 0.5 %
Eosinophils Absolute: 182 {cells}/uL (ref 15–500)
Eosinophils Relative: 2.3 %
HCT: 46.6 % — ABNORMAL HIGH (ref 35.0–45.0)
Hemoglobin: 15.7 g/dL — ABNORMAL HIGH (ref 11.7–15.5)
MCH: 30.1 pg (ref 27.0–33.0)
MCHC: 33.7 g/dL (ref 32.0–36.0)
MCV: 89.3 fL (ref 80.0–100.0)
MPV: 10.5 fL (ref 7.5–12.5)
Monocytes Relative: 7.8 %
Neutro Abs: 3847 {cells}/uL (ref 1500–7800)
Neutrophils Relative %: 48.7 %
Platelets: 308 Thousand/uL (ref 140–400)
RBC: 5.22 Million/uL — ABNORMAL HIGH (ref 3.80–5.10)
RDW: 13 % (ref 11.0–15.0)
Total Lymphocyte: 40.7 %
WBC: 7.9 Thousand/uL (ref 3.8–10.8)

## 2024-06-04 LAB — LIPID PANEL
Cholesterol: 198 mg/dL (ref <200)
HDL: 46 mg/dL — ABNORMAL LOW (ref >=50)
LDL Cholesterol (Calc): 132 mg/dL — ABNORMAL HIGH
Non-HDL Cholesterol (Calc): 152 mg/dL — ABNORMAL HIGH (ref <130)
Total CHOL/HDL Ratio: 4.3 (calc) (ref <5.0)
Triglycerides: 94 mg/dL (ref <150)

## 2024-06-04 LAB — COMPREHENSIVE METABOLIC PANEL WITH GFR
AG Ratio: 1.7 (calc) (ref 1.0–2.5)
ALT: 10 U/L (ref 6–29)
AST: 13 U/L (ref 10–35)
Albumin: 4.3 g/dL (ref 3.6–5.1)
Alkaline phosphatase (APISO): 60 U/L (ref 31–125)
BUN: 12 mg/dL (ref 7–25)
CO2: 23 mmol/L (ref 20–32)
Calcium: 9.4 mg/dL (ref 8.6–10.2)
Chloride: 108 mmol/L (ref 98–110)
Creat: 0.83 mg/dL (ref 0.50–0.99)
Globulin: 2.5 g/dL (ref 1.9–3.7)
Glucose, Bld: 80 mg/dL (ref 65–99)
Potassium: 3.8 mmol/L (ref 3.5–5.3)
Sodium: 140 mmol/L (ref 135–146)
Total Bilirubin: 0.2 mg/dL (ref 0.2–1.2)
Total Protein: 6.8 g/dL (ref 6.1–8.1)
eGFR: 87 mL/min/1.73m2 (ref >=60)

## 2024-06-04 LAB — TSH: TSH: 2.96 m[IU]/L

## 2024-06-04 NOTE — Progress Notes (Signed)
 Subjective:    Patient ID: Karen [name], female    DOB: 1975-11-25, 48 y.o.   MRN: [id number]  Patient is a very sweet 48 year old Caucasian female here today for a physical exam.  Patient had a positive Cologuard test last year that prompted her to get a colonoscopy.  They found numerous polyps.  They recommended a repeat colonoscopy in 2027.  Her last mammogram was September 2024.  She is already scheduled that for this year.  Her Pap smear was performed in 2024.  There was no sign of any high risk HPV.  This is due again in 2027.  The remainder of her immunizations are up-to-date other than an annual flu shot and COVID shot in the fall.  She has lost considerable weight on Zepbound .  She would like to recheck her cholesterol.  Her LDL was elevated in May at 145.  She has changed her diet to address this.    Past Medical History:  Diagnosis Date   Anxiety    Depression    Gallbladder problem    GERD (gastroesophageal reflux disease)    Gluten intolerance    Heartburn    High cholesterol    Hypothyroidism    subclinical/borderline   Lactose intolerance    Lactose intolerance    Palpitations    Prediabetes    Smoker 02/23/2013   Smoker    SOBOE (shortness of breath on exertion)    Thyroid  disease    Phreesia 03/07/2020   Vitamin D  deficiency    Past Surgical History:  Procedure Laterality Date   BREAST EXCISIONAL BIOPSY Right 2021   Complex sclerosing lesion.  - Biopsy site.  - Fibrocystic change.   BREAST LUMPECTOMY WITH RADIOACTIVE SEED LOCALIZATION Right 07/20/2020   Procedure: RIGHT BREAST LUMPECTOMY WITH RADIOACTIVE SEED LOCALIZATION;  Surgeon: Vanderbilt Ned, MD;  Location: [facility];  Service: General;  Laterality: Right;   CHOLECYSTECTOMY N/A    age 38   CHOLECYSTECTOMY, LAPAROSCOPIC      Current Outpatient Medications on File Prior to Visit  Medication Sig Dispense Refill   aspirin EC 81 MG tablet Take 81 mg by mouth daily.     buPROPion   (WELLBUTRIN  SR) 200 MG 12 hr tablet Take 1 tablet (200 mg total) by mouth daily. 90 tablet 0   fish oil-omega-3 fatty acids 1000 MG capsule Take 1,000 mg by mouth daily.      levothyroxine  (SYNTHROID ) 88 MCG tablet Take 1 tablet (88 mcg total) by mouth daily. 90 tablet 3   magnesium gluconate (MAGONATE) 500 MG tablet Take 500 mg by mouth 2 (two) times daily.     Multiple Vitamin (MULTIVITAMIN ADULT PO) Take by mouth. 14,000 IU weekly     pantoprazole  (PROTONIX ) 40 MG tablet Take 1 tablet (40 mg total) by mouth daily before breakfast. 90 tablet 3   tirzepatide  (ZEPBOUND ) 12.5 MG/0.5ML Pen Inject 12.5 mg into the skin once a week. 2 mL 0   VITAMIN D  PO Take by mouth.     No current facility-administered medications on file prior to visit.   Allergies  Allergen Reactions   Penicillins Hives   Social History   Socioeconomic History   Marital status: Married    Spouse name: Christeena Krogh   Number of children: 3   Years of education: Not on file   Highest education level: Not on file  Occupational History   Not on file  Tobacco Use   Smoking status: Former    Current  packs/day: 0.00    Types: Cigarettes    Quit date: 2017    Years since quitting: 8.6    Passive exposure: Past   Smokeless tobacco: Former    Quit date: 03/25/2016  Vaping Use   Vaping status: Former   Quit date: 03/25/2016  Substance and Sexual Activity   Alcohol use: Not Currently    Alcohol/week: 0.0 standard drinks of alcohol    Comment: Rare-maybe once a year   Drug use: No   Sexual activity: Yes  Other Topics Concern   Not on file  Social History Narrative   Not on file   Social Drivers of Health   Financial Resource Strain: Not on file  Food Insecurity: Not on file  Transportation Needs: Not on file  Physical Activity: Not on file  Stress: Not on file  Social Connections: Not on file  Intimate Partner Violence: Not on file   Family History  Problem Relation Age of Onset   Diabetes Mother     Hyperlipidemia Mother    Hypertension Mother    Anxiety disorder Mother    Obesity Mother    Anxiety disorder Father    Hypertension Father    Depression Father    Colon cancer Neg Hx      Review of Systems     Objective:   Physical Exam Constitutional:      General: She is not in acute distress.    Appearance: She is well-developed. She is not diaphoretic.  HENT:     Head: Normocephalic and atraumatic.     Right Ear: External ear normal.     Left Ear: External ear normal.     Nose: Nose normal.     Mouth/Throat:     Pharynx: No oropharyngeal exudate.  Eyes:     General: No scleral icterus.       Right eye: No discharge.        Left eye: No discharge.     Conjunctiva/sclera: Conjunctivae normal.     Pupils: Pupils are equal, round, and reactive to light.  Neck:     Thyroid : No thyromegaly.     Vascular: No JVD.     Trachea: No tracheal deviation.  Cardiovascular:     Rate and Rhythm: Normal rate and regular rhythm.     Heart sounds: Normal heart sounds. No murmur heard.    No friction rub. No gallop.  Pulmonary:     Effort: Pulmonary effort is normal. No respiratory distress.     Breath sounds: Normal breath sounds. No stridor. No wheezing or rales.  Chest:     Chest wall: No tenderness.  Abdominal:     General: Bowel sounds are normal. There is no distension.     Palpations: Abdomen is soft.     Tenderness: There is no abdominal tenderness. There is no guarding or rebound.  Musculoskeletal:        General: No tenderness or deformity. Normal range of motion.     Cervical back: Normal range of motion and neck supple.  Lymphadenopathy:     Cervical: No cervical adenopathy.  Skin:    General: Skin is warm.     Coloration: Skin is not pale.  Neurological:     Mental Status: She is alert and oriented to person, place, and time.     Cranial Nerves: No cranial nerve deficit.     Motor: No abnormal muscle tone.     Coordination: Coordination normal.     Deep  Tendon Reflexes: Reflexes are normal and symmetric.  Psychiatric:        Behavior: Behavior normal.        Thought Content: Thought content normal.        Judgment: Judgment normal.           Assessment & Plan:  Mixed hyperlipidemia - Plan: CBC with Differential/Platelet, Comprehensive metabolic panel with GFR, Lipid panel  Other specified hypothyroidism - Plan: TSH  Pure hypercholesterolemia  General medical exam I congratulated the patient on her weight loss.  Her blood pressure is excellent.  Immunizations are up-to-date.  Mammogram is pending.  Pap smear and colonoscopy are up-to-date.  Check CBC, CMP, lipid panel.  If LDL cholesterol is considerably greater than 130 I would recommend a statin versus a coronary artery calcium score.  Check TSH to ensure adequate dosage of levothyroxine .

## 2024-06-05 ENCOUNTER — Ambulatory Visit: Payer: Self-pay | Admitting: Family Medicine

## 2024-06-05 ENCOUNTER — Other Ambulatory Visit (HOSPITAL_COMMUNITY): Payer: Self-pay

## 2024-06-16 ENCOUNTER — Ambulatory Visit: Payer: Self-pay

## 2024-06-16 ENCOUNTER — Encounter: Payer: Self-pay | Admitting: Family Medicine

## 2024-06-16 ENCOUNTER — Ambulatory Visit: Admitting: Family Medicine

## 2024-06-16 VITALS — BP 118/82 | HR 74 | Temp 98.2°F | Ht 63.0 in | Wt 175.0 lb

## 2024-06-16 DIAGNOSIS — M25531 Pain in right wrist: Secondary | ICD-10-CM | POA: Diagnosis not present

## 2024-06-16 NOTE — Assessment & Plan Note (Signed)
 Pt presents with 2 days right radial, volar wrist pain without known inciting event. No history of similar. 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 No redness or warmth to the wrist. Declines imaging at this time however an x-ray is not unreasonable. Has tried Ibuprofen . Encouraged to ice, limit aggravating activities, and splint. Continue NSAIDs or Tylenol  PRN. If symptoms persist or worsen return to office.

## 2024-06-16 NOTE — Progress Notes (Signed)
 Subjective:  HPI: Karen [name] is a 48 y.o. female presenting on 06/16/2024 for Acute Visit (Rt Wrist pain since yesterday. Pain is sharp pressure  )   HPI Patient is in today for sharp pain to her right wrist since yesterday. She worked this weekend and noticed soreness yesterday morning, she is a Engineer, civil (consulting). The pain radiates up her arm. Does have PMH of ganglion cysts. Denies trauma, injury, redness, warmth, fever, chills, night sweats, weight loss. Has tried Ibuprofen  without relief.    Review of Systems  All other systems reviewed and are negative.   Relevant past medical history reviewed and updated as indicated.   Past Medical History:  Diagnosis Date   Allergy    Not sure if I am truly allergic to penicillin   Anxiety    Depression    Gallbladder problem    GERD (gastroesophageal reflux disease)    Gluten intolerance    Heartburn    High cholesterol    Hypothyroidism    subclinical/borderline   Lactose intolerance    Lactose intolerance    Palpitations    Prediabetes    Smoker 02/23/2013   Smoker    SOBOE (shortness of breath on exertion)    Thyroid  disease    Phreesia 03/07/2020   Vitamin D  deficiency      Past Surgical History:  Procedure Laterality Date   BREAST EXCISIONAL BIOPSY Right 2021   Complex sclerosing lesion.  - Biopsy site.  - Fibrocystic change.   BREAST LUMPECTOMY WITH RADIOACTIVE SEED LOCALIZATION Right 07/20/2020   Procedure: RIGHT BREAST LUMPECTOMY WITH RADIOACTIVE SEED LOCALIZATION;  Surgeon: Vanderbilt Ned, MD;  Location: [facility];  Service: General;  Laterality: Right;   BREAST SURGERY  10/21   Lumpectomy   CHOLECYSTECTOMY N/A    age 76   CHOLECYSTECTOMY, LAPAROSCOPIC      Allergies and medications reviewed and updated.   Current Outpatient Medications:    aspirin EC 81 MG tablet, Take 81 mg by mouth daily., Disp: , Rfl:    buPROPion  (WELLBUTRIN  SR) 200 MG 12 hr tablet, Take 1 tablet (200 mg total) by mouth  daily., Disp: 90 tablet, Rfl: 0   fish oil-omega-3 fatty acids 1000 MG capsule, Take 1,000 mg by mouth daily. , Disp: , Rfl:    levothyroxine  (SYNTHROID ) 88 MCG tablet, Take 1 tablet (88 mcg total) by mouth daily., Disp: 90 tablet, Rfl: 3   magnesium gluconate (MAGONATE) 500 MG tablet, Take 500 mg by mouth 2 (two) times daily., Disp: , Rfl:    Multiple Vitamin (MULTIVITAMIN ADULT PO), Take by mouth. 14,000 IU weekly, Disp: , Rfl:    pantoprazole  (PROTONIX ) 40 MG tablet, Take 1 tablet (40 mg total) by mouth daily before breakfast., Disp: 90 tablet, Rfl: 3   tirzepatide  (ZEPBOUND ) 12.5 MG/0.5ML Pen, Inject 12.5 mg into the skin once a week., Disp: 2 mL, Rfl: 0   VITAMIN D  PO, Take by mouth., Disp: , Rfl:   Allergies  Allergen Reactions   Penicillins Hives    Objective:   BP 118/82   Pulse 74   Temp 98.2 F (36.8 C)   Ht 5' 3 (1.6 m)   Wt 175 lb (79.4 kg)   LMP 05/28/2024 (Approximate)   SpO2 97%   BMI 31.00 kg/m      06/16/2024    3:31 PM 06/04/2024   11:08 AM 05/12/2024    3:00 PM  Vitals with BMI  Height 5' 3 5' 3 5' 3  Weight 175 lbs 172 lbs 10 oz 171 lbs  BMI 31.01 30.58 30.3  Systolic 118 126 861  Diastolic 82 62 81  Pulse 74 65 73     Physical Exam Vitals and nursing note reviewed.  Constitutional:      Appearance: Normal appearance. She is normal weight.  HENT:     Head: Normocephalic and atraumatic.  Musculoskeletal:     Right wrist: Swelling and tenderness present. No effusion or bony tenderness. Normal pulse.     Left wrist: Normal.       Arms:  Skin:    General: Skin is warm and dry.  Neurological:     General: No focal deficit present.     Mental Status: She is alert and oriented to person, place, and time. Mental status is at baseline.  Psychiatric:        Mood and Affect: Mood normal.        Behavior: Behavior normal.        Thought Content: Thought content normal.        Judgment: Judgment normal.     Assessment & Plan:  Right wrist  pain Assessment & Plan: Pt presents with 2 days right radial, volar wrist pain without known inciting event. No history of similar. 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 No redness or warmth to the wrist. Declines imaging at this time however an x-ray is not unreasonable. Has tried Ibuprofen . Encouraged to ice, limit aggravating activities, and splint. Continue NSAIDs or Tylenol  PRN. If symptoms persist or worsen return to office.   Orders: -     DG Wrist Complete Right; Future     Follow up plan: Return if symptoms worsen or fail to improve.  Jeoffrey GORMAN Barrio, FNP

## 2024-06-16 NOTE — Telephone Encounter (Signed)
 FYI Only or Action Required?: Action required by provider: request for appointment.  Patient was last seen in primary care on 06/04/2024 by Karen [name] DASEN, MD.  Called Nurse Triage reporting Wrist Pain.  Symptoms began yesterday.  Interventions attempted: OTC medications: Motrin .  Symptoms are: gradually worsening. Right wrist started hurting yesterday, no injury. Mild swelling.  Triage Disposition: See HCP Within 4 Hours (Or PCP Triage)  Patient/caregiver understands and will follow disposition?: Yes     Copied from CRM [id number]. Topic: Clinical - Red Word Triage >> Jun 16, 2024  2:09 PM Karen [name] wrote: Red Word that prompted transfer to Nurse Triage: Right wrist has been hurting. There is some swelling and pain is increasing. No other symptoms Reason for Disposition  [1] SEVERE pain (e.g., excruciating, unable to use wrist at all) AND [2] not improved after 2 hours of pain medicine  Answer Assessment - Initial Assessment Questions 1. ONSET: When did the pain start?     yesterday 2. LOCATION: Where is the pain located?     Right wrist 3. PAIN: How bad is the pain? (Scale 1-10; or mild, moderate, severe)     9 4. WORK OR EXERCISE: Has there been any recent work or exercise that involved this part (i.e., hand or wrist) of the body?     no 5. CAUSE: What do you think is causing the pain?     unsure 6. AGGRAVATING FACTORS: What makes the pain worse? (e.g., using computer)     movement 7. OTHER SYMPTOMS: Do you have any other symptoms? (e.g., fever, neck pain, numbness or tingling, rash, swelling)     swelling 8. PREGNANCY: Is there any chance you are pregnant? When was your last menstrual period?     no  Protocols used: Wrist Pain-A-AH

## 2024-06-19 ENCOUNTER — Other Ambulatory Visit: Payer: Self-pay

## 2024-06-19 DIAGNOSIS — E7849 Other hyperlipidemia: Secondary | ICD-10-CM

## 2024-06-23 ENCOUNTER — Ambulatory Visit (INDEPENDENT_AMBULATORY_CARE_PROVIDER_SITE_OTHER): Admitting: Family Medicine

## 2024-06-23 ENCOUNTER — Encounter (INDEPENDENT_AMBULATORY_CARE_PROVIDER_SITE_OTHER): Payer: Self-pay | Admitting: Family Medicine

## 2024-06-23 VITALS — BP 105/69 | HR 64 | Temp 97.9°F | Ht 63.0 in | Wt 169.0 lb

## 2024-06-23 DIAGNOSIS — F5089 Other specified eating disorder: Secondary | ICD-10-CM

## 2024-06-23 DIAGNOSIS — Z6829 Body mass index (BMI) 29.0-29.9, adult: Secondary | ICD-10-CM

## 2024-06-23 DIAGNOSIS — R5383 Other fatigue: Secondary | ICD-10-CM

## 2024-06-23 DIAGNOSIS — Z6830 Body mass index (BMI) 30.0-30.9, adult: Secondary | ICD-10-CM

## 2024-06-23 DIAGNOSIS — F3289 Other specified depressive episodes: Secondary | ICD-10-CM

## 2024-06-23 DIAGNOSIS — E66812 Obesity, class 2: Secondary | ICD-10-CM

## 2024-06-23 DIAGNOSIS — R632 Polyphagia: Secondary | ICD-10-CM

## 2024-06-23 DIAGNOSIS — E038 Other specified hypothyroidism: Secondary | ICD-10-CM

## 2024-06-23 NOTE — Progress Notes (Signed)
 Office: [phone]  /  Fax: [phone]  WEIGHT SUMMARY AND BIOMETRICS  Anthropometric Measurements Height: 5' 3 (1.6 m) Weight: 169 lb (76.7 kg) BMI (Calculated): 29.94 Weight at Last Visit: 171 lb Weight Lost Since Last Visit: 2 lb Weight Gained Since Last Visit: 0 Starting Weight: 224 lb Total Weight Loss (lbs): 55 lb (24.9 kg) Peak Weight: 245 lb   Body Composition  Body Fat %: 41.3 % Fat Mass (lbs): 70 lbs Muscle Mass (lbs): 94.4 lbs Total Body Water (lbs): 71.4 lbs Visceral Fat Rating : 9   Other Clinical Data Fasting: no Labs: no Today's Visit #: 22 Starting Date: 05/31/22    Chief Complaint: OBESITY   History of Present Illness Karen [name] is a 48 year old female who presents for obesity treatment and progress assessment.  She has been adhering to the category two eating plan 95% of the time, resulting in a weight loss of two pounds over the last six weeks. She is not currently engaging in regular exercise but is making dietary changes by incorporating more fruits, vegetables, lean protein, and ensuring adequate hydration. She is focusing on not skipping meals and maintaining a sleep schedule of seven to nine hours per night.  She is currently taking Orbitrin and Zepbound  12.5 mg without experiencing any gastrointestinal issues or constipation. She uses Miralax as needed to manage potential constipation.  Her cholesterol levels have changed, with LDL decreasing from 145 to 132 and triglycerides from 129 to 94. She reports reducing her red meat consumption.  No gastrointestinal issues or constipation reported.      PHYSICAL EXAM:  Blood pressure 105/69, pulse 64, temperature 97.9 F (36.6 C), height 5' 3 (1.6 m), weight 169 lb (76.7 kg), last menstrual period 05/28/2024, SpO2 98%. Body mass index is 29.94 kg/m.  DIAGNOSTIC DATA REVIEWED:  BMET    Component Value Date/Time   NA 140 06/04/2024 1144   NA 140 03/04/2024 1602   K 3.8  06/04/2024 1144   CL 108 06/04/2024 1144   CO2 23 06/04/2024 1144   GLUCOSE 80 06/04/2024 1144   BUN 12 06/04/2024 1144   BUN 10 03/04/2024 1602   CREATININE 0.83 06/04/2024 1144   CALCIUM 9.4 06/04/2024 1144   GFRNONAA 105 04/06/2021 1113   GFRAA 121 04/06/2021 1113   Lab Results  Component Value Date   HGBA1C 5.4 03/04/2024   HGBA1C 5.2 11/13/2021   Lab Results  Component Value Date   INSULIN  12.3 03/04/2024   INSULIN  20.7 05/31/2022   Lab Results  Component Value Date   TSH 2.96 06/04/2024   CBC    Component Value Date/Time   WBC 7.9 06/04/2024 1144   RBC 5.22 (H) 06/04/2024 1144   HGB 15.7 (H) 06/04/2024 1144   HGB 15.7 05/31/2022 1156   HCT 46.6 (H) 06/04/2024 1144   HCT 46.2 05/31/2022 1156   PLT 308 06/04/2024 1144   PLT 268 05/31/2022 1156   MCV 89.3 06/04/2024 1144   MCV 87 05/31/2022 1156   MCH 30.1 06/04/2024 1144   MCHC 33.7 06/04/2024 1144   RDW 13.0 06/04/2024 1144   RDW 14.1 05/31/2022 1156   Iron Studies No results found for: IRON, TIBC, FERRITIN, IRONPCTSAT Lipid Panel     Component Value Date/Time   CHOL 198 06/04/2024 1144   CHOL 219 (H) 03/04/2024 1602   TRIG 94 06/04/2024 1144   HDL 46 (L) 06/04/2024 1144   HDL 51 03/04/2024 1602   CHOLHDL 4.3 06/04/2024 1144  VLDL 25 12/27/2016 1115   LDLCALC 132 (H) 06/04/2024 1144   Hepatic Function Panel     Component Value Date/Time   PROT 6.8 06/04/2024 1144   PROT 6.7 03/04/2024 1602   ALBUMIN 4.3 03/04/2024 1602   AST 13 06/04/2024 1144   ALT 10 06/04/2024 1144   ALKPHOS 73 03/04/2024 1602   BILITOT 0.2 06/04/2024 1144   BILITOT <0.2 03/04/2024 1602      Component Value Date/Time   TSH 2.96 06/04/2024 1144   Nutritional Lab Results  Component Value Date   VD25OH 74.0 03/04/2024   VD25OH 51.2 10/23/2023   VD25OH 63 03/07/2023     Assessment and Plan Assessment & Plan Obesity, class 2 Obesity, class 2, with a gradual weight loss of 2 pounds over the last six  weeks. She adheres to the category two eating plan 95% of the time, focusing on fruits, vegetables, lean protein, and hydration. She is considering starting strengthening exercises. No issues with current medication regimen of Zepbound  12.5 mg. No gastrointestinal side effects reported, and she manages potential constipation with Miralax as needed. Discussed the importance of not losing muscle mass and the benefits of strengthening exercises, such as using a treadmill with incline or walking uphill, which combine cardio and strength training. Consideration of wrist and ankle weights or weighted vests for strengthening, with attention to joint health. - Continue category two eating plan. - Encourage incorporation of strengthening exercises, such as using a treadmill with incline or walking uphill. - Consider wrist and ankle weights or weighted vests for strengthening, considering joint health. - Continue Zepbound  12.5 mg. - Provide recipe ideas to support dietary plan.  Emotional eating behaviors Emotional eating behaviors is managed with Wellbutrin  SR 200 mg. She reports doing well on this medication with no changes needed. - Continue Wellbutrin  SR 200 mg. - Continue to be be mindful of situations which increase EEB       Karen [name] was informed of the importance of frequent follow up visits to maximize her success with intensive lifestyle modifications for her obesity and obesity related health conditions as recommended by USPSTF and CMS guidelines, follow up in 6 weeks   Louann Penton, MD

## 2024-07-08 ENCOUNTER — Other Ambulatory Visit (HOSPITAL_COMMUNITY): Payer: Self-pay | Admitting: Family Medicine

## 2024-07-08 DIAGNOSIS — Z1231 Encounter for screening mammogram for malignant neoplasm of breast: Secondary | ICD-10-CM

## 2024-07-13 ENCOUNTER — Ambulatory Visit (HOSPITAL_COMMUNITY)
Admission: RE | Admit: 2024-07-13 | Discharge: 2024-07-13 | Disposition: A | Discharge status: Home / Self Care | Source: Ambulatory Visit

## 2024-07-13 DIAGNOSIS — Z1231 Encounter for screening mammogram for malignant neoplasm of breast: Secondary | ICD-10-CM | POA: Insufficient documentation

## 2024-07-28 ENCOUNTER — Other Ambulatory Visit (HOSPITAL_COMMUNITY): Payer: Self-pay

## 2024-08-12 ENCOUNTER — Ambulatory Visit (INDEPENDENT_AMBULATORY_CARE_PROVIDER_SITE_OTHER): Admitting: Family Medicine

## 2024-08-19 ENCOUNTER — Encounter (INDEPENDENT_AMBULATORY_CARE_PROVIDER_SITE_OTHER): Payer: Self-pay | Admitting: Family Medicine

## 2024-08-19 ENCOUNTER — Ambulatory Visit (INDEPENDENT_AMBULATORY_CARE_PROVIDER_SITE_OTHER): Admitting: Family Medicine

## 2024-08-19 ENCOUNTER — Other Ambulatory Visit: Payer: Self-pay

## 2024-08-19 ENCOUNTER — Other Ambulatory Visit (HOSPITAL_COMMUNITY): Payer: Self-pay

## 2024-08-19 VITALS — BP 130/82 | HR 66 | Temp 98.0°F | Ht 63.0 in | Wt 169.0 lb

## 2024-08-19 DIAGNOSIS — F3289 Other specified depressive episodes: Secondary | ICD-10-CM

## 2024-08-19 DIAGNOSIS — N951 Menopausal and female climacteric states: Secondary | ICD-10-CM | POA: Diagnosis not present

## 2024-08-19 DIAGNOSIS — E669 Obesity, unspecified: Secondary | ICD-10-CM | POA: Diagnosis not present

## 2024-08-19 DIAGNOSIS — Z6829 Body mass index (BMI) 29.0-29.9, adult: Secondary | ICD-10-CM | POA: Diagnosis not present

## 2024-08-19 DIAGNOSIS — R632 Polyphagia: Secondary | ICD-10-CM

## 2024-08-19 DIAGNOSIS — F5089 Other specified eating disorder: Secondary | ICD-10-CM

## 2024-08-19 MED ORDER — BUPROPION HCL ER (SR) 200 MG PO TB12
200.0000 mg | ORAL_TABLET | Freq: Every day | ORAL | 0 refills | Status: DC
  Filled 2024-08-19: qty 90, 90d supply, fill #0

## 2024-08-19 NOTE — Progress Notes (Signed)
 Office: [phone]  /  Fax: [phone]  WEIGHT SUMMARY AND BIOMETRICS  Anthropometric Measurements Height: 5' 3 (1.6 m) Weight: 169 lb (76.7 kg) BMI (Calculated): 29.94 Weight at Last Visit: 169 lb Weight Lost Since Last Visit: 0 Weight Gained Since Last Visit: 0 Starting Weight: 224 lb Total Weight Loss (lbs): 55 lb (24.9 kg) Peak Weight: 245 lb   Body Composition  Body Fat %: 42.2 % Fat Mass (lbs): 71.4 lbs Muscle Mass (lbs): 93 lbs Total Body Water (lbs): 70.2 lbs Visceral Fat Rating : 9   Other Clinical Data Fasting: no Labs: no Today's Visit #: 23 Starting Date: 05/31/22    Chief Complaint: OBESITY    History of Present Illness Karen [name] is a 48 year old female who presents for obesity treatment and progress assessment.  She has been following the category two eating plan approximately eighty percent of the time. Her physical activity includes riding a resistance bike two to three times a week for fifteen minutes. Her weight has remained stable over the last two months since her previous visit.  She is currently being treated for emotional eating behavior with Wellbutrin  SR 200 mg daily. She also takes Zepbound  12.5 mg for polyphagia. She experiences increased temptations with food, especially at work due to a constant presence of a candy box. Stress at work is identified as a trigger for her emotional eating, described as a 'PTSD stress type thing'.  She has recently started experiencing hot flashes and has missed her period for two months. The hot flashes are described as alternating between feeling hot and cold every two minutes, beginning one to two weeks ago. No impact on sleep is reported, and she does not believe she is pregnant.  She is considering changes to her health insurance plan, opting for a high deductible plan starting in January, which may affect the frequency of her appointments.      PHYSICAL EXAM:  Blood pressure 130/82,  pulse 66, temperature 98 F (36.7 C), height 5' 3 (1.6 m), weight 169 lb (76.7 kg), SpO2 99%. Body mass index is 29.94 kg/m.  DIAGNOSTIC DATA REVIEWED:  BMET    Component Value Date/Time   NA 140 06/04/2024 1144   NA 140 03/04/2024 1602   K 3.8 06/04/2024 1144   CL 108 06/04/2024 1144   CO2 23 06/04/2024 1144   GLUCOSE 80 06/04/2024 1144   BUN 12 06/04/2024 1144   BUN 10 03/04/2024 1602   CREATININE 0.83 06/04/2024 1144   CALCIUM 9.4 06/04/2024 1144   GFRNONAA 105 04/06/2021 1113   GFRAA 121 04/06/2021 1113   Lab Results  Component Value Date   HGBA1C 5.4 03/04/2024   HGBA1C 5.2 11/13/2021   Lab Results  Component Value Date   INSULIN  12.3 03/04/2024   INSULIN  20.7 05/31/2022   Lab Results  Component Value Date   TSH 2.96 06/04/2024   CBC    Component Value Date/Time   WBC 7.9 06/04/2024 1144   RBC 5.22 (H) 06/04/2024 1144   HGB 15.7 (H) 06/04/2024 1144   HGB 15.7 05/31/2022 1156   HCT 46.6 (H) 06/04/2024 1144   HCT 46.2 05/31/2022 1156   PLT 308 06/04/2024 1144   PLT 268 05/31/2022 1156   MCV 89.3 06/04/2024 1144   MCV 87 05/31/2022 1156   MCH 30.1 06/04/2024 1144   MCHC 33.7 06/04/2024 1144   RDW 13.0 06/04/2024 1144   RDW 14.1 05/31/2022 1156   Iron Studies No results found for:  IRON, TIBC, FERRITIN, IRONPCTSAT Lipid Panel     Component Value Date/Time   CHOL 198 06/04/2024 1144   CHOL 219 (H) 03/04/2024 1602   TRIG 94 06/04/2024 1144   HDL 46 (L) 06/04/2024 1144   HDL 51 03/04/2024 1602   CHOLHDL 4.3 06/04/2024 1144   VLDL 25 12/27/2016 1115   LDLCALC 132 (H) 06/04/2024 1144   Hepatic Function Panel     Component Value Date/Time   PROT 6.8 06/04/2024 1144   PROT 6.7 03/04/2024 1602   ALBUMIN 4.3 03/04/2024 1602   AST 13 06/04/2024 1144   ALT 10 06/04/2024 1144   ALKPHOS 73 03/04/2024 1602   BILITOT 0.2 06/04/2024 1144   BILITOT <0.2 03/04/2024 1602      Component Value Date/Time   TSH 2.96 06/04/2024 1144    Nutritional Lab Results  Component Value Date   VD25OH 74.0 03/04/2024   VD25OH 51.2 10/23/2023   VD25OH 63 03/07/2023     Assessment and Plan Assessment & Plan Obesity with emotional eating behavior and polyphagia Obesity with emotional eating behavior and polyphagia. She is on Wellbutrin  SR 200 mg daily for emotional eating and Zepbound  12.5 mg for polyphagia. She reports difficulty adhering to the category two eating plan, with compliance at 80% or less. She engages in resistance biking 2-3 times a week for 15 minutes. Weight has been maintained over the last two months. She prefers to focus on weight maintenance during the upcoming holidays due to stress and environmental challenges at work. Strategies for managing emotional eating during the holidays were discussed, including planning personal snacks and using high-protein, low-sugar options. - Continue Wellbutrin  SR 200 mg daily. - Continue Zepbound  12.5 mg for polyphagia. - Encouraged maintaining current weight during the holidays. - Provided strategies for managing emotional eating during the holidays, including planning personal snacks and using high-protein, low-sugar options. - Scheduled follow-up in 4-6 weeks, with potential to extend to 2-3 months based on financial considerations.  Perimenopausal symptoms Experiencing perimenopausal symptoms, including hot flashes and amenorrhea for two months. Symptoms are affecting quality of life but not sleep. Discussed the variability of perimenopausal symptoms and the potential for hormone replacement therapy if symptoms significantly impact daily life. Consideration of non-hormonal treatments was mentioned, though details were not provided. - Discuss hormone levels with primary care or GYN if symptoms worsen. - Consider hormone replacement therapy if symptoms significantly impact quality of life.     Deetra was counseled on the importance of maintaining healthy lifestyle habits,  including balanced nutrition, regular physical activity, and behavioral modifications, while taking antiobesity medication.  Patient verbalized understanding that medication is an adjunct to, not a replacement for, lifestyle changes and that the long-term success and weight maintenance depend on continued adherence to these strategies.   Aunesty was informed of the importance of frequent follow up visits to maximize her success with intensive lifestyle modifications for her obesity and obesity related health conditions as recommended by USPSTF and CMS guidelines   Louann Penton, MD

## 2024-09-30 ENCOUNTER — Ambulatory Visit (INDEPENDENT_AMBULATORY_CARE_PROVIDER_SITE_OTHER): Admitting: Adult Health

## 2024-10-28 ENCOUNTER — Other Ambulatory Visit (HOSPITAL_COMMUNITY): Payer: Self-pay

## 2024-10-28 ENCOUNTER — Encounter (INDEPENDENT_AMBULATORY_CARE_PROVIDER_SITE_OTHER): Payer: Self-pay | Admitting: Adult Health

## 2024-10-28 ENCOUNTER — Ambulatory Visit (INDEPENDENT_AMBULATORY_CARE_PROVIDER_SITE_OTHER): Admitting: Adult Health

## 2024-10-28 VITALS — BP 120/78 | HR 78 | Temp 97.9°F | Ht 63.0 in | Wt 174.0 lb

## 2024-10-28 DIAGNOSIS — E559 Vitamin D deficiency, unspecified: Secondary | ICD-10-CM | POA: Diagnosis not present

## 2024-10-28 DIAGNOSIS — R632 Polyphagia: Secondary | ICD-10-CM

## 2024-10-28 DIAGNOSIS — F3289 Other specified depressive episodes: Secondary | ICD-10-CM | POA: Diagnosis not present

## 2024-10-28 DIAGNOSIS — E669 Obesity, unspecified: Secondary | ICD-10-CM | POA: Diagnosis not present

## 2024-10-28 DIAGNOSIS — Z6829 Body mass index (BMI) 29.0-29.9, adult: Secondary | ICD-10-CM

## 2024-10-28 MED ORDER — ZEPBOUND 15 MG/0.5ML ~~LOC~~ SOLN: 15.0000 mg | SUBCUTANEOUS | 1 refills | Status: AC

## 2024-10-28 MED ORDER — BUPROPION HCL ER (SR) 200 MG PO TB12
200.0000 mg | ORAL_TABLET | Freq: Every day | ORAL | 0 refills | Status: AC
  Filled 2024-10-28: qty 90, 90d supply, fill #0

## 2024-10-28 NOTE — Progress Notes (Signed)
 "    WEIGHT SUMMARY AND BIOMETRICS  Vitals Temp: 97.9 F (36.6 C) BP: 120/78 Pulse Rate: 78 SpO2: 98 %   Anthropometric Measurements Height: 5' 3 (1.6 m) Weight: 174 lb (78.9 kg) BMI (Calculated): 30.83 Weight at Last Visit: 169lb Weight Lost Since Last Visit: 0lb Weight Gained Since Last Visit: 5lb Starting Weight: 224lb Total Weight Loss (lbs): 50 lb (22.7 kg) Peak Weight: 245lb Waist Measurement : 50.5 inches   Body Composition  Body Fat %: 41.8 % Fat Mass (lbs): 73 lbs Muscle Mass (lbs): 96.4 lbs Total Body Water (lbs): 69 lbs Visceral Fat Rating : 9   Other Clinical Data RMR: [id number] Fasting: No Labs: no Today's Visit #: 24 Starting Date: 05/31/22    Chief Complaint:   OBESITY Karen [name] is here to discuss her progress with her obesity treatment plan.  She is on the the Category 2 Plan and states she is following her eating plan approximately 70 % of the time.  She states she is exercising Bike 20 minutes 2 times per week.  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  She is currently on Zepbound  12.5mg  pens She has been removing a desired dose from the pen, then administering between 2.5mg  to 3.33mg  each week. This has allowed her to extend supply and reduce cost. Discussed converting to Zepbound  15mg  vial and administering 5mg  weekly- under aseptic technique  Subjective:   1. Polyphagia She has been on Saxenda , Wegovy , and Ozempic  She is currently on Zepbound  12.5mg  pens She has been removing a desired dose from the pen, then administering between 2.5mg  to 3.33mg  each week. This has allowed her to extend supply and reduce cost. Discussed converting to Zepbound  15mg  vial and administering 5mg  weekly- under aseptic technique Denies mass in neck, dysphagia, dyspepsia, persistent hoarseness, abdominal pain, or N/V/C   2. Vitamin D  deficiency  Latest Reference Range & Units  03/07/23 08:57 10/23/23 14:38 03/04/24 16:02  Vitamin D , 25-Hydroxy 30.0 - 100.0 ng/mL 63 51.2 74.0   She is on daily OTC MVI  3. Emotional Eating Behavior BP stable at OV She is on daily Wellbutrin  SR 200mg  She reports stable mood, denies SI/HI  Assessment/Plan:   1. Polyphagia Continue healthy eating and regular exercise  2. Vitamin D  deficiency Continue current supplementation Monitor Labs  3. Emotional Eating Behavior Refill - buPROPion  (WELLBUTRIN  SR) 200 MG 12 hr tablet; Take 1 tablet (200 mg total) by mouth daily.  Dispense: 90 tablet; Refill: 0  4. BMI 29.0-29.9,adult (Primary) Refill and INCREASE - Tirzepatide -Weight Management (ZEPBOUND ) 15 MG/0.5ML SOLN; Inject 15 mg into the skin once a week. Please send vials  Dispense: 4 mL; Refill: 1  Karen [name] is currently in the action stage of change. As such, her goal is to continue with weight loss efforts. She has agreed to the Category 2 Plan.   Exercise goals: For substantial health benefits, adults should do at least 150 minutes (2 hours and 30 minutes) a week of moderate-intensity, or 75 minutes (1 hour and 15 minutes) a week of vigorous-intensity aerobic physical activity, or an equivalent combination of moderate- and vigorous-intensity aerobic activity. Aerobic activity should be performed in episodes of at least 10 minutes, and preferably, it should be spread throughout the week.  Behavioral modification strategies: increasing lean protein intake, decreasing simple carbohydrates, increasing vegetables, increasing water intake, no skipping meals, meal planning and cooking strategies, keeping healthy foods  in the home, ways to avoid boredom eating, and planning for success.  Karen [name] has agreed to follow-up with our clinic in 4 weeks. She was informed of the importance of frequent follow-up visits to maximize her success with intensive lifestyle modifications for her multiple health conditions.   Objective:   Blood pressure  120/78, pulse 78, temperature 97.9 F (36.6 C), height 5' 3 (1.6 m), weight 174 lb (78.9 kg), SpO2 98%. Body mass index is 30.82 kg/m.  General: Cooperative, alert, well developed, in no acute distress. HEENT: Conjunctivae and lids unremarkable. Cardiovascular: Regular rhythm.  Lungs: Normal work of breathing. Neurologic: No focal deficits.   Lab Results  Component Value Date   CREATININE 0.83 06/04/2024   BUN 12 06/04/2024   NA 140 06/04/2024   K 3.8 06/04/2024   CL 108 06/04/2024   CO2 23 06/04/2024   Lab Results  Component Value Date   ALT 10 06/04/2024   AST 13 06/04/2024   ALKPHOS 73 03/04/2024   BILITOT 0.2 06/04/2024   Lab Results  Component Value Date   HGBA1C 5.4 03/04/2024   HGBA1C 5.8 (H) 03/07/2023   HGBA1C 6.0 (H) 05/31/2022   HGBA1C 5.2 11/13/2021   Lab Results  Component Value Date   INSULIN  12.3 03/04/2024   INSULIN  20.7 05/31/2022   Lab Results  Component Value Date   TSH 2.96 06/04/2024   Lab Results  Component Value Date   CHOL 198 06/04/2024   HDL 46 (L) 06/04/2024   LDLCALC 132 (H) 06/04/2024   TRIG 94 06/04/2024   CHOLHDL 4.3 06/04/2024   Lab Results  Component Value Date   VD25OH 74.0 03/04/2024   VD25OH 51.2 10/23/2023   VD25OH 63 03/07/2023   Lab Results  Component Value Date   WBC 7.9 06/04/2024   HGB 15.7 (H) 06/04/2024   HCT 46.6 (H) 06/04/2024   MCV 89.3 06/04/2024   PLT 308 06/04/2024   No results found for: IRON, TIBC, FERRITIN  Attestation Statements:   Reviewed by clinician on day of visit: allergies, medications, problem list, medical history, surgical history, family history, social history, and previous encounter notes.  I have reviewed the above documentation for accuracy and completeness, and I agree with the above. -  Elfa Wooton d. Patrich Heinze, NP-C "

## 2024-10-29 ENCOUNTER — Other Ambulatory Visit: Payer: Self-pay

## 2024-10-29 ENCOUNTER — Other Ambulatory Visit (HOSPITAL_COMMUNITY): Payer: Self-pay

## 2024-11-19 ENCOUNTER — Other Ambulatory Visit (HOSPITAL_COMMUNITY): Payer: Self-pay

## 2024-12-16 ENCOUNTER — Ambulatory Visit (INDEPENDENT_AMBULATORY_CARE_PROVIDER_SITE_OTHER): Admitting: Family Medicine

## 2025-01-20 ENCOUNTER — Ambulatory Visit (INDEPENDENT_AMBULATORY_CARE_PROVIDER_SITE_OTHER): Admitting: Family Medicine

## 2025-06-07 ENCOUNTER — Encounter: Admitting: Family Medicine
